# Patient Record
Sex: Female | Born: 1990 | Hispanic: Yes | Marital: Married | Smoking: Never smoker
Health system: Southern US, Community
[De-identification: ages and names within clinical notes are randomized; demographics above are authoritative.]

## PROBLEM LIST (undated history)

## (undated) ENCOUNTER — Inpatient Hospital Stay (HOSPITAL_COMMUNITY): Payer: Self-pay

## (undated) ENCOUNTER — Emergency Department (HOSPITAL_COMMUNITY)

## (undated) DIAGNOSIS — F419 Anxiety disorder, unspecified: Secondary | ICD-10-CM

## (undated) DIAGNOSIS — G44209 Tension-type headache, unspecified, not intractable: Secondary | ICD-10-CM

## (undated) HISTORY — PX: NASAL SINUS SURGERY: SHX719

## (undated) HISTORY — PX: GASTRIC BYPASS: SHX52

## (undated) HISTORY — PX: APPENDECTOMY: SHX54

---

## 2013-12-08 ENCOUNTER — Emergency Department (HOSPITAL_BASED_OUTPATIENT_CLINIC_OR_DEPARTMENT_OTHER)
Admission: EM | Admit: 2013-12-08 | Discharge: 2013-12-08 | Discharge status: Against Medical Advice | Payer: Medicaid Other | Attending: Emergency Medicine | Admitting: Emergency Medicine

## 2013-12-08 ENCOUNTER — Encounter (HOSPITAL_BASED_OUTPATIENT_CLINIC_OR_DEPARTMENT_OTHER): Payer: Self-pay | Admitting: Emergency Medicine

## 2013-12-08 DIAGNOSIS — R110 Nausea: Secondary | ICD-10-CM | POA: Insufficient documentation

## 2013-12-08 DIAGNOSIS — R51 Headache: Secondary | ICD-10-CM | POA: Diagnosis not present

## 2013-12-08 NOTE — ED Notes (Addendum)
Headache and nausea since this am. She drove herself.

## 2014-04-13 HISTORY — PX: OTHER SURGICAL HISTORY: SHX169

## 2015-04-18 ENCOUNTER — Other Ambulatory Visit (HOSPITAL_COMMUNITY): Payer: Self-pay | Admitting: Specialist

## 2015-04-19 ENCOUNTER — Other Ambulatory Visit (HOSPITAL_COMMUNITY): Payer: Self-pay | Admitting: Specialist

## 2015-04-19 DIAGNOSIS — N895 Stricture and atresia of vagina: Secondary | ICD-10-CM

## 2019-03-31 ENCOUNTER — Other Ambulatory Visit: Payer: Self-pay

## 2019-03-31 DIAGNOSIS — Z20822 Contact with and (suspected) exposure to covid-19: Secondary | ICD-10-CM

## 2019-04-01 LAB — NOVEL CORONAVIRUS, NAA: SARS-CoV-2, NAA: DETECTED — AB

## 2019-04-02 ENCOUNTER — Telehealth: Payer: Self-pay

## 2019-04-02 NOTE — Telephone Encounter (Signed)
Pt given Covid-19 positive results. Discussed mild, moderate and severe symptoms. Advised pt to call 911 for any respiratory issues and/dehydration. Discussed non test criteria for ending self isolation. Pt advised of way to manage symptoms at home and review isolation precautions especially the importance of washing hands frequently and wearing a mask when around others. Pt verbalized understanding. Will report to HD. 

## 2019-04-10 ENCOUNTER — Telehealth: Payer: Self-pay | Admitting: *Deleted

## 2019-04-10 NOTE — Telephone Encounter (Signed)
Pt called for result of COVID test obtained 04/08/2019 at the [facility] in [place], [place]; explained this was not a [facility] site, therefore there is no access for this result; pt instructed to contact the [facility]; she verbalized understanding.

## 2019-07-29 ENCOUNTER — Emergency Department (HOSPITAL_BASED_OUTPATIENT_CLINIC_OR_DEPARTMENT_OTHER): Payer: 59

## 2019-07-29 ENCOUNTER — Encounter (HOSPITAL_BASED_OUTPATIENT_CLINIC_OR_DEPARTMENT_OTHER): Payer: Self-pay | Admitting: Emergency Medicine

## 2019-07-29 ENCOUNTER — Emergency Department (HOSPITAL_BASED_OUTPATIENT_CLINIC_OR_DEPARTMENT_OTHER)
Admission: EM | Admit: 2019-07-29 | Discharge: 2019-07-29 | Disposition: A | Discharge status: Home / Self Care | Payer: 59 | Attending: Emergency Medicine | Admitting: Emergency Medicine

## 2019-07-29 ENCOUNTER — Encounter (HOSPITAL_COMMUNITY): Payer: Self-pay | Admitting: Emergency Medicine

## 2019-07-29 ENCOUNTER — Other Ambulatory Visit: Payer: Self-pay

## 2019-07-29 DIAGNOSIS — R1031 Right lower quadrant pain: Secondary | ICD-10-CM | POA: Diagnosis present

## 2019-07-29 DIAGNOSIS — Z9884 Bariatric surgery status: Secondary | ICD-10-CM | POA: Insufficient documentation

## 2019-07-29 DIAGNOSIS — Z79899 Other long term (current) drug therapy: Secondary | ICD-10-CM | POA: Insufficient documentation

## 2019-07-29 DIAGNOSIS — N130 Hydronephrosis with ureteropelvic junction obstruction: Secondary | ICD-10-CM | POA: Diagnosis not present

## 2019-07-29 DIAGNOSIS — N200 Calculus of kidney: Secondary | ICD-10-CM

## 2019-07-29 HISTORY — DX: Anxiety disorder, unspecified: F41.9

## 2019-07-29 LAB — URINALYSIS, ROUTINE W REFLEX MICROSCOPIC
Glucose, UA: 100 mg/dL — AB
Ketones, ur: >80 mg/dL — AB
Leukocytes,Ua: NEGATIVE
Nitrite: NEGATIVE
Protein, ur: 30 mg/dL — AB
Specific Gravity, Urine: >1.03 — ABNORMAL HIGH (ref 1.005–1.030)
pH: 6 (ref 5.0–8.0)

## 2019-07-29 LAB — URINALYSIS, MICROSCOPIC (REFLEX): RBC / HPF: >50 RBC/hpf (ref 0–5)

## 2019-07-29 LAB — CBC WITH DIFFERENTIAL/PLATELET
Abs Immature Granulocytes: 0.03 10*3/uL (ref 0.00–0.07)
Basophils Absolute: 0 10*3/uL (ref 0.0–0.1)
Basophils Relative: 0 %
Eosinophils Absolute: 0.1 10*3/uL (ref 0.0–0.5)
Eosinophils Relative: 1 %
HCT: 39.6 % (ref 36.0–46.0)
Hemoglobin: 13.3 g/dL (ref 12.0–15.0)
Immature Granulocytes: 0 %
Lymphocytes Relative: 21 %
Lymphs Abs: 1.5 10*3/uL (ref 0.7–4.0)
MCH: 29 pg (ref 26.0–34.0)
MCHC: 33.6 g/dL (ref 30.0–36.0)
MCV: 86.5 fL (ref 80.0–100.0)
Monocytes Absolute: 0.5 10*3/uL (ref 0.1–1.0)
Monocytes Relative: 7 %
Neutro Abs: 5.3 10*3/uL (ref 1.7–7.7)
Neutrophils Relative %: 71 %
Platelets: 245 10*3/uL (ref 150–400)
RBC: 4.58 MIL/uL (ref 3.87–5.11)
RDW: 13.5 % (ref 11.5–15.5)
WBC: 7.4 10*3/uL (ref 4.0–10.5)
nRBC: 0 % (ref 0.0–0.2)

## 2019-07-29 LAB — BASIC METABOLIC PANEL
Anion gap: 13 (ref 5–15)
BUN: 13 mg/dL (ref 6–20)
CO2: 21 mmol/L — ABNORMAL LOW (ref 22–32)
Calcium: 9.4 mg/dL (ref 8.9–10.3)
Chloride: 103 mmol/L (ref 98–111)
Creatinine, Ser: 0.79 mg/dL (ref 0.44–1.00)
GFR calc Af Amer: >60 mL/min (ref >60)
GFR calc non Af Amer: >60 mL/min (ref >60)
Glucose, Bld: 80 mg/dL (ref 70–99)
Potassium: 3.6 mmol/L (ref 3.5–5.1)
Sodium: 137 mmol/L (ref 135–145)

## 2019-07-29 LAB — HEPATIC FUNCTION PANEL
ALT: 174 U/L — ABNORMAL HIGH (ref 0–44)
AST: 127 U/L — ABNORMAL HIGH (ref 15–41)
Albumin: 4.4 g/dL (ref 3.5–5.0)
Alkaline Phosphatase: 68 U/L (ref 38–126)
Bilirubin, Direct: 1.2 mg/dL — ABNORMAL HIGH (ref 0.0–0.2)
Indirect Bilirubin: 1.4 mg/dL — ABNORMAL HIGH (ref 0.3–0.9)
Total Bilirubin: 2.6 mg/dL — ABNORMAL HIGH (ref 0.3–1.2)
Total Protein: 7.7 g/dL (ref 6.5–8.1)

## 2019-07-29 LAB — LIPASE, BLOOD: Lipase: 33 U/L (ref 11–51)

## 2019-07-29 LAB — PREGNANCY, URINE: Preg Test, Ur: NEGATIVE

## 2019-07-29 MED ORDER — KETOROLAC TROMETHAMINE 15 MG/ML IJ SOLN: 15.0000 mg | Freq: Once | INTRAMUSCULAR | Status: DC

## 2019-07-29 MED ORDER — OXYCODONE HCL 5 MG PO TABS: 5.0000 mg | ORAL_TABLET | ORAL | 0 refills | Status: DC | PRN

## 2019-07-29 MED ORDER — SODIUM CHLORIDE 0.9 % IV BOLUS
1000.0000 mL | Freq: Once | INTRAVENOUS | Status: AC
  Administered 2019-07-29: 11:00:00 1000 mL via INTRAVENOUS

## 2019-07-29 MED ORDER — ONDANSETRON HCL 4 MG/2ML IJ SOLN
4.0000 mg | Freq: Once | INTRAMUSCULAR | Status: AC
  Administered 2019-07-29: 13:00:00 4 mg via INTRAVENOUS
  Filled 2019-07-29: qty 2

## 2019-07-29 MED ORDER — CEPHALEXIN 500 MG PO CAPS: 500.0000 mg | ORAL_CAPSULE | Freq: Two times a day (BID) | ORAL | 0 refills | Status: AC

## 2019-07-29 MED ORDER — HYDROMORPHONE HCL 1 MG/ML IJ SOLN
1.0000 mg | Freq: Once | INTRAMUSCULAR | Status: AC
  Administered 2019-07-29: 1 mg via INTRAVENOUS
  Filled 2019-07-29: qty 1

## 2019-07-29 MED ORDER — TAMSULOSIN HCL 0.4 MG PO CAPS: 0.4000 mg | ORAL_CAPSULE | Freq: Every day | ORAL | 0 refills | Status: AC

## 2019-07-29 MED ORDER — MORPHINE SULFATE (PF) 4 MG/ML IV SOLN
4.0000 mg | Freq: Once | INTRAVENOUS | Status: AC
  Administered 2019-07-29: 4 mg via INTRAVENOUS
  Filled 2019-07-29: qty 1

## 2019-07-29 MED ORDER — ONDANSETRON HCL 4 MG PO TABS: 4.0000 mg | ORAL_TABLET | Freq: Four times a day (QID) | ORAL | 0 refills | Status: AC

## 2019-07-29 MED ORDER — ONDANSETRON HCL 4 MG/2ML IJ SOLN
4.0000 mg | Freq: Once | INTRAMUSCULAR | Status: AC
  Administered 2019-07-29: 4 mg via INTRAVENOUS
  Filled 2019-07-29: qty 2

## 2019-07-29 NOTE — ED Provider Notes (Signed)
[facility] EMERGENCY DEPARTMENT Provider Note   CSN: [id number] Arrival date & time: 07/29/19  1029     History Chief Complaint  Patient presents with   Flank Pain    Kristin Salazar is a 29 y.o. female.  Patient with severe right-sided flank pain this morning.  History of appendicitis and had appendix removed in the past.  History of gastric bypass.  Denies history of kidney stones.  No pain with urination.  The history is provided by the patient.  Flank Pain This is a new problem. The current episode started 1 to 2 hours ago. The problem occurs constantly. Associated symptoms include abdominal pain. Pertinent negatives include no chest pain, no headaches and no shortness of breath. Nothing aggravates the symptoms. Nothing relieves the symptoms. She has tried nothing for the symptoms. The treatment provided no relief.       Past Medical History:  Diagnosis Date   Anxiety     There are no problems to display for this patient.   Past Surgical History:  Procedure Laterality Date   APPENDECTOMY     CESAREAN SECTION     GASTRIC BYPASS     NASAL SINUS SURGERY       OB History   No obstetric history on file.     No family history on file.  Social History   Tobacco Use   Smoking status: Never Smoker   Smokeless tobacco: Never Used  Substance Use Topics   Alcohol use: No   Drug use: No    Home Medications Prior to Admission medications   Medication Sig Start Date End Date Taking? Authorizing Provider  Multiple Vitamin (MULTIVITAMIN) tablet Take 1 tablet by mouth daily.   Yes [provider]  cephALEXin (KEFLEX) 500 MG capsule Take 1 capsule (500 mg total) by mouth 2 (two) times daily for 7 days. 07/29/19 08/05/19  Oriana Horiuchi, DO  ondansetron (ZOFRAN) 4 MG tablet Take 1 tablet (4 mg total) by mouth every 6 (six) hours for 15 doses. 07/29/19 08/02/19  Ronique Simerly, DO  oxyCODONE (ROXICODONE) 5 MG immediate release tablet Take 1  tablet (5 mg total) by mouth every 4 (four) hours as needed for up to 15 doses for severe pain. 07/29/19   Taiesha Bovard, DO  tamsulosin (FLOMAX) 0.4 MG CAPS capsule Take 1 capsule (0.4 mg total) by mouth daily for 5 days. 07/29/19 08/03/19  Virgina Norfolk, DO    Allergies    Patient has no known allergies.  Review of Systems   Review of Systems  Constitutional: Negative for chills and fever.  HENT: Negative for ear pain and sore throat.   Eyes: Negative for pain and visual disturbance.  Respiratory: Negative for cough and shortness of breath.   Cardiovascular: Negative for chest pain and palpitations.  Gastrointestinal: Positive for abdominal pain and nausea. Negative for vomiting.  Genitourinary: Positive for flank pain. Negative for decreased urine volume, dysuria, hematuria, menstrual problem, pelvic pain, urgency, vaginal bleeding, vaginal discharge and vaginal pain.  Musculoskeletal: Negative for arthralgias and back pain.  Skin: Negative for color change and rash.  Neurological: Negative for seizures, syncope and headaches.  All other systems reviewed and are negative.   Physical Exam Updated Vital Signs  ED Triage Vitals  Enc Vitals Group     BP 07/29/19 1035 115/62     Pulse Rate 07/29/19 1033 64     Resp 07/29/19 1033 18     Temp 07/29/19 1033 98.1 F (36.7 C)  Temp Source 07/29/19 1033 Oral     SpO2 07/29/19 1032 100 %     Weight 07/29/19 1031 228 lb (103.4 kg)     Height 07/29/19 1031 5\' 5"  (1.651 m)     Head Circumference --      Peak Flow --      Pain Score 07/29/19 1031 10     Pain Loc --      Pain Edu? --      Excl. in [place]? --     Physical Exam Vitals and nursing note reviewed.  Constitutional:      General: She is in acute distress.     Appearance: She is well-developed.  HENT:     Head: Normocephalic and atraumatic.     Nose: Nose normal.     Mouth/Throat:     Mouth: Mucous membranes are moist.  Eyes:     Conjunctiva/sclera: Conjunctivae  normal.     Pupils: Pupils are equal, round, and reactive to light.  Cardiovascular:     Rate and Rhythm: Normal rate and regular rhythm.     Pulses: Normal pulses.     Heart sounds: Normal heart sounds. No murmur.  Pulmonary:     Effort: Pulmonary effort is normal. No respiratory distress.     Breath sounds: Normal breath sounds.  Abdominal:     General: There is no distension.     Palpations: Abdomen is soft.     Tenderness: There is no abdominal tenderness. There is right CVA tenderness.  Musculoskeletal:        General: Normal range of motion.     Cervical back: Normal range of motion and neck supple.  Skin:    General: Skin is warm and dry.     Capillary Refill: Capillary refill takes less than 2 seconds.  Neurological:     General: No focal deficit present.     Mental Status: She is alert.     ED Results / Procedures / Treatments   Labs (all labs ordered are listed, but only abnormal results are displayed) Labs Reviewed  URINALYSIS, ROUTINE W REFLEX MICROSCOPIC - Abnormal; Notable for the following components:      Result Value   APPearance HAZY (*)    Specific Gravity, Urine >1.030 (*)    Glucose, UA 100 (*)    Hgb urine dipstick LARGE (*)    Bilirubin Urine MODERATE (*)    Ketones, ur >80 (*)    Protein, ur 30 (*)    All other components within normal limits  BASIC METABOLIC PANEL - Abnormal; Notable for the following components:   CO2 21 (*)    All other components within normal limits  HEPATIC FUNCTION PANEL - Abnormal; Notable for the following components:   AST 127 (*)    ALT 174 (*)    Total Bilirubin 2.6 (*)    Bilirubin, Direct 1.2 (*)    Indirect Bilirubin 1.4 (*)    All other components within normal limits  URINALYSIS, MICROSCOPIC (REFLEX) - Abnormal; Notable for the following components:   Bacteria, UA MANY (*)    All other components within normal limits  PREGNANCY, URINE  CBC WITH DIFFERENTIAL/PLATELET  LIPASE, BLOOD     EKG None  Radiology CT Renal Stone Study  Result Date: 07/29/2019 CLINICAL DATA:  Abdominal pain, nausea, vomiting constipation. Reported mini gastric bypass surgery on 07/10/2019 in [place]. EXAM: CT ABDOMEN AND PELVIS WITHOUT CONTRAST TECHNIQUE: Multidetector CT imaging of the abdomen and pelvis was performed following  the standard protocol without IV contrast. COMPARISON:  10/09/2016 CT abdomen/pelvis. FINDINGS: Lower chest: No significant pulmonary nodules or acute consolidative airspace disease. Hepatobiliary: Normal liver size. No liver mass. Normal gallbladder with no radiopaque cholelithiasis. No biliary ductal dilatation. Pancreas: Normal, with no mass or duct dilation. Spleen: Normal size. No mass. Adrenals/Urinary Tract: Normal adrenals. Obstructing 3 mm right ureterovesical junction stone with mild to moderate right hydroureteronephrosis. No stones in the kidneys. No left hydronephrosis. Normal caliber left ureter. No additional ureteral stones. No contour deforming renal masses. Otherwise normal nondistended bladder. Stomach/Bowel: Postsurgical changes from gastric bypass surgery with apparent gastrojejunostomy and collapsed excluded remnant distal stomach with no appreciable acute gastric abnormality. Normal caliber small bowel with no small bowel wall thickening. Appendectomy. Normal large bowel with no diverticulosis, large bowel wall thickening or pericolonic fat stranding. Vascular/Lymphatic: Normal caliber abdominal aorta. No pathologically enlarged lymph nodes in the abdomen or pelvis. Reproductive: Grossly normal uterus.  No adnexal mass. Other: No pneumoperitoneum, ascites or focal fluid collection. Musculoskeletal: No aggressive appearing focal osseous lesions. IMPRESSION: Obstructing 3 mm right UVJ stone with mild to moderate right hydroureteronephrosis. Postsurgical changes from gastric bypass surgery without appreciable complication on this noncontrast CT study. Electronically  Signed   By: Delbert Phenix M.D.   On: 07/29/2019 12:48    Procedures Procedures (including critical care time)  Medications Ordered in ED Medications  morphine 4 MG/ML injection 4 mg (has no administration in time range)  ondansetron (ZOFRAN) injection 4 mg (has no administration in time range)  HYDROmorphone (DILAUDID) injection 1 mg (1 mg Intravenous Given 07/29/19 1058)  ondansetron (ZOFRAN) injection 4 mg (4 mg Intravenous Given 07/29/19 1058)  sodium chloride 0.9 % bolus 1,000 mL (0 mLs Intravenous Stopped 07/29/19 1207)    ED Course  I have reviewed the triage vital signs and the nursing notes.  Pertinent labs & imaging results that were available during my care of the patient were reviewed by me and considered in my medical decision making (see chart for details).    MDM Rules/Calculators/A&P                      Kristin Salazar is a 29 year old female with history of gastric bypass, appendectomy who presents the ED with right-sided flank pain.  Patient with normal vitals.  No fever.  Pain started this morning.  Severe and right-sided.  No hematuria.  No pain with urination.  Has right CVA tenderness on exam.  Likely pyelonephritis versus kidney stone.  Possibly gallbladder or liver etiology including cholecystitis.  Possibly pancreatitis.  Will get lab work including CT scan abdomen and pelvis.  Will give IV fluids, IV Zofran, IV Dilaudid.  Will check pregnancy test.  Patient with 3 mm obstructing right UVJ kidney stone. No significant leukocytosis. Urinalysis with negative nitrates and leukocytes. However many bacteria. Will conservatively treat with antibiotics. Otherwise no significant anemia, electrolyte abnormality. Liver and gallbladder enzymes mildly elevated however on CT scan they are normal appearing. Likely in the setting of nausea and vomiting dehydration. Overall patient feels improved after IV fluids and IV pain medicine. Will prescribe Keflex, Zofran, Flomax,  oxycodone. Will have her follow-up with urology. Understands return precautions.  This chart was dictated using voice recognition software.  Despite best efforts to proofread,  errors can occur which can change the documentation meaning.    Final Clinical Impression(s) / ED Diagnoses Final diagnoses:  Kidney calculus    Rx / DC Orders ED Discharge Orders  Ordered    oxyCODONE (ROXICODONE) 5 MG immediate release tablet  Every 4 hours PRN     07/29/19 1312    cephALEXin (KEFLEX) 500 MG capsule  2 times daily     07/29/19 1312    ondansetron (ZOFRAN) 4 MG tablet  Every 6 hours     07/29/19 1312    tamsulosin (FLOMAX) 0.4 MG CAPS capsule  Daily     07/29/19 1312           Virgina Norfolk, DO 07/29/19 1312

## 2019-07-29 NOTE — ED Notes (Signed)
Pt ambulatory with steady gait to restroom to provide urine specimen 

## 2019-07-29 NOTE — ED Triage Notes (Signed)
Patient was seen today. Patient states she has not taken the antibiotic but has taken the nausea and pain medication. Patient states she could not take the antibiotic because they are to big to take. Patient states that the doctor told her to come back if her pain does not get better.

## 2019-07-29 NOTE — ED Triage Notes (Signed)
Pt arrives via EMS c/o R flank pain since 6 am. States she vomited once. Denies urinary symptoms

## 2019-07-30 ENCOUNTER — Emergency Department (HOSPITAL_COMMUNITY)
Admission: EM | Admit: 2019-07-30 | Discharge: 2019-07-30 | Disposition: A | Discharge status: Home / Self Care | Payer: 59 | Source: Home / Self Care | Attending: Emergency Medicine | Admitting: Emergency Medicine

## 2019-07-30 DIAGNOSIS — N23 Unspecified renal colic: Secondary | ICD-10-CM

## 2019-07-30 DIAGNOSIS — R7401 Elevation of levels of liver transaminase levels: Secondary | ICD-10-CM

## 2019-07-30 DIAGNOSIS — N201 Calculus of ureter: Secondary | ICD-10-CM

## 2019-07-30 DIAGNOSIS — R112 Nausea with vomiting, unspecified: Secondary | ICD-10-CM

## 2019-07-30 DIAGNOSIS — E872 Acidosis, unspecified: Secondary | ICD-10-CM

## 2019-07-30 DIAGNOSIS — R17 Unspecified jaundice: Secondary | ICD-10-CM

## 2019-07-30 LAB — COMPREHENSIVE METABOLIC PANEL
ALT: 151 U/L — ABNORMAL HIGH (ref 0–44)
AST: 86 U/L — ABNORMAL HIGH (ref 15–41)
Albumin: 4 g/dL (ref 3.5–5.0)
Alkaline Phosphatase: 62 U/L (ref 38–126)
Anion gap: 15 (ref 5–15)
BUN: 10 mg/dL (ref 6–20)
CO2: 15 mmol/L — ABNORMAL LOW (ref 22–32)
Calcium: 9.2 mg/dL (ref 8.9–10.3)
Chloride: 107 mmol/L (ref 98–111)
Creatinine, Ser: 0.92 mg/dL (ref 0.44–1.00)
GFR calc Af Amer: >60 mL/min (ref >60)
GFR calc non Af Amer: >60 mL/min (ref >60)
Glucose, Bld: 97 mg/dL (ref 70–99)
Potassium: 4.6 mmol/L (ref 3.5–5.1)
Sodium: 137 mmol/L (ref 135–145)
Total Bilirubin: 2.2 mg/dL — ABNORMAL HIGH (ref 0.3–1.2)
Total Protein: 7.2 g/dL (ref 6.5–8.1)

## 2019-07-30 LAB — CBC WITH DIFFERENTIAL/PLATELET
Abs Immature Granulocytes: 0.02 10*3/uL (ref 0.00–0.07)
Basophils Absolute: 0 10*3/uL (ref 0.0–0.1)
Basophils Relative: 0 %
Eosinophils Absolute: 0 10*3/uL (ref 0.0–0.5)
Eosinophils Relative: 0 %
HCT: 38.2 % (ref 36.0–46.0)
Hemoglobin: 12.5 g/dL (ref 12.0–15.0)
Immature Granulocytes: 0 %
Lymphocytes Relative: 9 %
Lymphs Abs: 0.7 10*3/uL (ref 0.7–4.0)
MCH: 29.1 pg (ref 26.0–34.0)
MCHC: 32.7 g/dL (ref 30.0–36.0)
MCV: 88.8 fL (ref 80.0–100.0)
Monocytes Absolute: 0.5 10*3/uL (ref 0.1–1.0)
Monocytes Relative: 6 %
Neutro Abs: 7 10*3/uL (ref 1.7–7.7)
Neutrophils Relative %: 85 %
Platelets: 204 10*3/uL (ref 150–400)
RBC: 4.3 MIL/uL (ref 3.87–5.11)
RDW: 13.5 % (ref 11.5–15.5)
WBC: 8.3 10*3/uL (ref 4.0–10.5)
nRBC: 0 % (ref 0.0–0.2)

## 2019-07-30 MED ORDER — PROCHLORPERAZINE 25 MG RE SUPP: 25.0000 mg | Freq: Two times a day (BID) | RECTAL | 0 refills | Status: DC | PRN

## 2019-07-30 MED ORDER — KETOROLAC TROMETHAMINE 30 MG/ML IJ SOLN
30.0000 mg | Freq: Once | INTRAMUSCULAR | Status: AC
  Administered 2019-07-30: 02:00:00 30 mg via INTRAVENOUS
  Filled 2019-07-30: qty 1

## 2019-07-30 MED ORDER — SODIUM CHLORIDE 0.9 % IV BOLUS
1000.0000 mL | Freq: Once | INTRAVENOUS | Status: AC
  Administered 2019-07-30: 1000 mL via INTRAVENOUS

## 2019-07-30 MED ORDER — PROCHLORPERAZINE EDISYLATE 10 MG/2ML IJ SOLN
10.0000 mg | Freq: Once | INTRAMUSCULAR | Status: AC
  Administered 2019-07-30: 10 mg via INTRAMUSCULAR
  Filled 2019-07-30: qty 2

## 2019-07-30 MED ORDER — MORPHINE SULFATE (PF) 4 MG/ML IV SOLN
4.0000 mg | Freq: Once | INTRAVENOUS | Status: AC
  Administered 2019-07-30: 02:00:00 4 mg via INTRAVENOUS
  Filled 2019-07-30: qty 1

## 2019-07-30 MED ORDER — ONDANSETRON HCL 4 MG/2ML IJ SOLN
4.0000 mg | Freq: Once | INTRAMUSCULAR | Status: AC
  Administered 2019-07-30: 02:00:00 4 mg via INTRAVENOUS
  Filled 2019-07-30: qty 2

## 2019-07-30 NOTE — ED Notes (Signed)
When pt started getting up for discharge she developed N/V. MD notified. Will give meds and reevaluate.

## 2019-07-30 NOTE — ED Notes (Signed)
Pt lying in bed awake. Pt feels better and is ready for d/c

## 2019-07-30 NOTE — ED Provider Notes (Addendum)
[facility]-EMERGENCY DEPT Provider Note   CSN: [id number] Arrival date & time: 07/29/19  2329   History Chief Complaint  Patient presents with  . Flank Pain    right side  . Back Pain    lower right    Kristin Salazar is a 29 y.o. female.  The history is provided by the patient.  Flank Pain  Back Pain She has history of kidney stone diagnosed this morning at [facility] and was discharged with prescriptions for oxycodone, cephalexin, ondansetron, tamsulosin.  Since discharge, she is continuing to have pain in the right flank and right lower abdomen which is severe and she rates it at 10/10.  She did take a dose of oxycodone with no relief.  She has not been able to take fluids because of ongoing nausea.  She has a constant sense of urinary urgency but has urinated very little.  She denies fever but has had some chills.  Past Medical History:  Diagnosis Date  . Anxiety     There are no problems to display for this patient.   Past Surgical History:  Procedure Laterality Date  . APPENDECTOMY    . CESAREAN SECTION    . GASTRIC BYPASS    . NASAL SINUS SURGERY       OB History   No obstetric history on file.     History reviewed. No pertinent family history.  Social History   Tobacco Use  . Smoking status: Never Smoker  . Smokeless tobacco: Never Used  Substance Use Topics  . Alcohol use: No  . Drug use: No    Home Medications Prior to Admission medications   Medication Sig Start Date End Date Taking? Authorizing Provider  cephALEXin (KEFLEX) 500 MG capsule Take 1 capsule (500 mg total) by mouth 2 (two) times daily for 7 days. 07/29/19 08/05/19  Virgina Norfolk, DO  Multiple Vitamin (MULTIVITAMIN) tablet Take 1 tablet by mouth daily.    [provider]  ondansetron (ZOFRAN) 4 MG tablet Take 1 tablet (4 mg total) by mouth every 6 (six) hours for 15 doses. 07/29/19 08/02/19  Curatolo, Adam, DO  oxyCODONE (ROXICODONE) 5 MG  immediate release tablet Take 1 tablet (5 mg total) by mouth every 4 (four) hours as needed for up to 15 doses for severe pain. 07/29/19   Curatolo, Adam, DO  tamsulosin (FLOMAX) 0.4 MG CAPS capsule Take 1 capsule (0.4 mg total) by mouth daily for 5 days. 07/29/19 08/03/19  Virgina Norfolk, DO    Allergies    Patient has no known allergies.  Review of Systems   Review of Systems  Genitourinary: Positive for flank pain.  Musculoskeletal: Positive for back pain.  All other systems reviewed and are negative.   Physical Exam Updated Vital Signs BP (!) 150/86 (BP Location: Right Arm)   Pulse (!) 107   Temp 98 F (36.7 C) (Oral)   Resp 18   Ht 5\' 5"  (1.651 m)   Wt 103.4 kg   SpO2 100%   BMI 37.94 kg/m   Physical Exam Vitals and nursing note reviewed.   29 year old female, appears uncomfortable, but is in no acute distress. Vital signs are significant for elevated heart rate and blood pressure. Oxygen saturation is 100%, which is normal. Head is normocephalic and atraumatic. PERRLA, EOMI. Oropharynx is clear. Neck is nontender and supple without adenopathy or JVD. Back is nontender and there is no CVA tenderness. Lungs are clear without rales, wheezes, or  rhonchi. Chest is nontender. Heart has regular rate and rhythm without murmur. Abdomen is soft, flat, with moderate right lower quadrant tenderness.  There is no rebound or guarding.  There are no masses or hepatosplenomegaly and peristalsis is hypoactive. Extremities have no cyanosis or edema, full range of motion is present. Skin is warm and dry without rash. Neurologic: Mental status is normal, cranial nerves are intact, there are no motor or sensory deficits.  ED Results / Procedures / Treatments   Labs (all labs ordered are listed, but only abnormal results are displayed) Labs Reviewed  COMPREHENSIVE METABOLIC PANEL - Abnormal; Notable for the following components:      Result Value   CO2 15 (*)    AST 86 (*)    ALT 151  (*)    Total Bilirubin 2.2 (*)    All other components within normal limits  CBC WITH DIFFERENTIAL/PLATELET    Radiology CT Renal Stone Study  Result Date: 07/29/2019 CLINICAL DATA:  Abdominal pain, nausea, vomiting constipation. Reported mini gastric bypass surgery on 07/10/2019 in [place]. EXAM: CT ABDOMEN AND PELVIS WITHOUT CONTRAST TECHNIQUE: Multidetector CT imaging of the abdomen and pelvis was performed following the standard protocol without IV contrast. COMPARISON:  10/09/2016 CT abdomen/pelvis. FINDINGS: Lower chest: No significant pulmonary nodules or acute consolidative airspace disease. Hepatobiliary: Normal liver size. No liver mass. Normal gallbladder with no radiopaque cholelithiasis. No biliary ductal dilatation. Pancreas: Normal, with no mass or duct dilation. Spleen: Normal size. No mass. Adrenals/Urinary Tract: Normal adrenals. Obstructing 3 mm right ureterovesical junction stone with mild to moderate right hydroureteronephrosis. No stones in the kidneys. No left hydronephrosis. Normal caliber left ureter. No additional ureteral stones. No contour deforming renal masses. Otherwise normal nondistended bladder. Stomach/Bowel: Postsurgical changes from gastric bypass surgery with apparent gastrojejunostomy and collapsed excluded remnant distal stomach with no appreciable acute gastric abnormality. Normal caliber small bowel with no small bowel wall thickening. Appendectomy. Normal large bowel with no diverticulosis, large bowel wall thickening or pericolonic fat stranding. Vascular/Lymphatic: Normal caliber abdominal aorta. No pathologically enlarged lymph nodes in the abdomen or pelvis. Reproductive: Grossly normal uterus.  No adnexal mass. Other: No pneumoperitoneum, ascites or focal fluid collection. Musculoskeletal: No aggressive appearing focal osseous lesions. IMPRESSION: Obstructing 3 mm right UVJ stone with mild to moderate right hydroureteronephrosis. Postsurgical changes from  gastric bypass surgery without appreciable complication on this noncontrast CT study. Electronically Signed   By: Ilona Sorrel M.D.   On: 07/29/2019 12:48   Procedures Procedures   Medications Ordered in ED Medications  sodium chloride 0.9 % bolus 1,000 mL (has no administration in time range)  ketorolac (TORADOL) 30 MG/ML injection 30 mg (has no administration in time range)  ondansetron (ZOFRAN) injection 4 mg (has no administration in time range)  morphine 4 MG/ML injection 4 mg (has no administration in time range)    ED Course  I have reviewed the triage vital signs and the nursing notes.  Pertinent labs & imaging results that were available during my care of the patient were reviewed by me and considered in my medical decision making (see chart for details).  MDM Rules/Calculators/A&P Ongoing ureteral colic.  Old records are reviewed confirming ED visit earlier today with CT showing 3 mm calculus at the right ureterovesical junction.  She also had mildly elevated transaminases and bilirubin.  We will give IV fluids, morphine, ondansetron, ketorolac and reassess.  No need for repeat imaging.  She has had excellent relief of pain here, but unfortunately cannot  take NSAIDs at home because of gastric bypass surgery.  Labs do show slight improvement in transaminases and bilirubin.  CO2 is now low but with normal anion gap.  This is felt to be secondary to vomiting and should correct with hydration.  She is discharged with prescription for prochlorperazine suppository, continue using tamsulosin and oxycodone and cephalexin.  Advised to use an aspirin suppository to get prostaglandin blockade.  Recommended repeat liver profile in 2 weeks, follow-up with urology.  Return precautions discussed.  Final Clinical Impression(s) / ED Diagnoses Final diagnoses:  Renal colic on right side  Ureterolithiasis  Non-intractable vomiting with nausea, unspecified vomiting type  Elevated transaminase level   Total bilirubin, elevated  Metabolic acidosis    Rx / DC Orders ED Discharge Orders         Ordered    prochlorperazine (COMPAZINE) 25 MG suppository  Every 12 hours PRN     07/30/19 0334           Dione Booze, MD 07/30/19 [phone]   As patient was being discharged, she had recurrence of nausea.  She will be given a dose of intramuscular prochlorperazine.   Dione Booze, MD 07/30/19 [phone]  She felt much better following intramuscular prochlorperazine.  She was ambulated in the ED without any difficulty and was discharged with the prescriptions noted above.   Dione Booze, MD 07/30/19 [phone]

## 2019-07-30 NOTE — ED Notes (Signed)
Pt lying in bed. Full monitor on. Pt denies any needs except pain meds. Will continue to monitor.

## 2019-07-30 NOTE — ED Notes (Signed)
Pt up walking around the room and states she feels much better. Pt is ready for d/c. No N/V.

## 2019-07-30 NOTE — Discharge Instructions (Addendum)
Please insert one aspirin suppository in the rectum once a day.  Return if pain or nausea are not being adequately controlled at home.  Return if you start running a fever.

## 2019-07-30 NOTE — ED Notes (Signed)
Pt lying in bed, eye's closed, chest rising and falling. NAD noted. Will continue to monitor.  

## 2021-07-15 IMAGING — CT CT RENAL STONE PROTOCOL
2 of 4 series · 16 of 46 positions shown, 18 images · non-contrast
Comparison: 10/09/2016 CT abdomen/pelvis.

CLINICAL DATA: Abdominal pain, nausea, vomiting constipation.
Reported mini gastric bypass surgery on 07/10/2019 in [REDACTED].

EXAM:
CT ABDOMEN AND PELVIS WITHOUT CONTRAST
TECHNIQUE: Multidetector CT imaging of the abdomen and pelvis was performed
following the standard protocol without IV contrast.

[Series 2: axial st · axial · 0.80mm/px · z∈[-802,-357]mm · 13 of 97 slices shown, 15 images]
[im 4/97  soft-tissue]
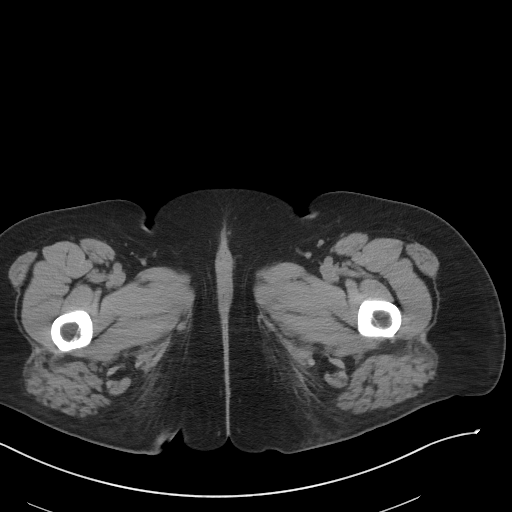
[im 4/97  bone]
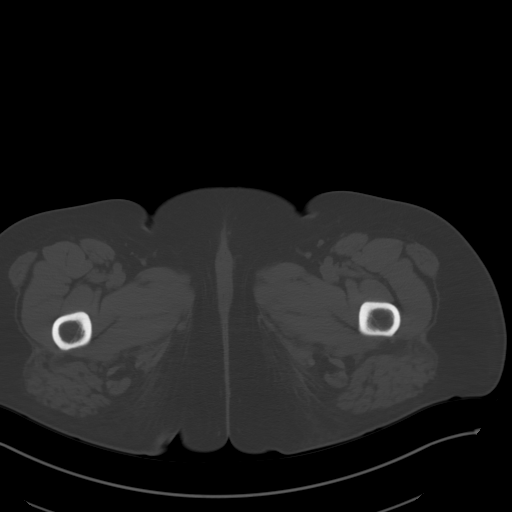
[im 12/97  soft-tissue]
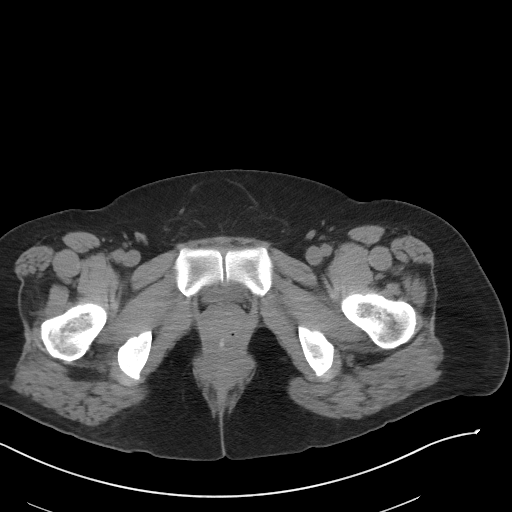
[im 19/97  soft-tissue]
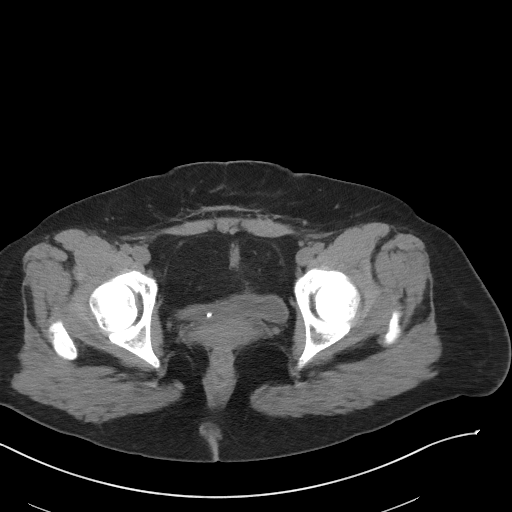
[im 26/97  soft-tissue]
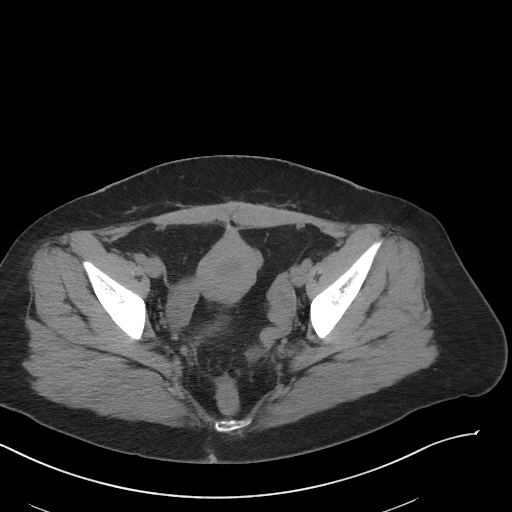
[im 34/97  soft-tissue]
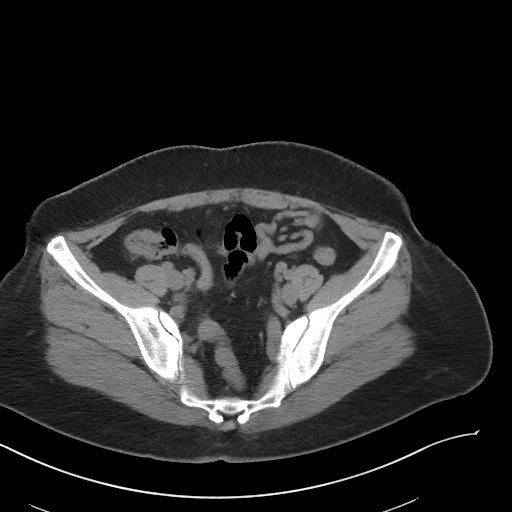
[im 41/97  soft-tissue]
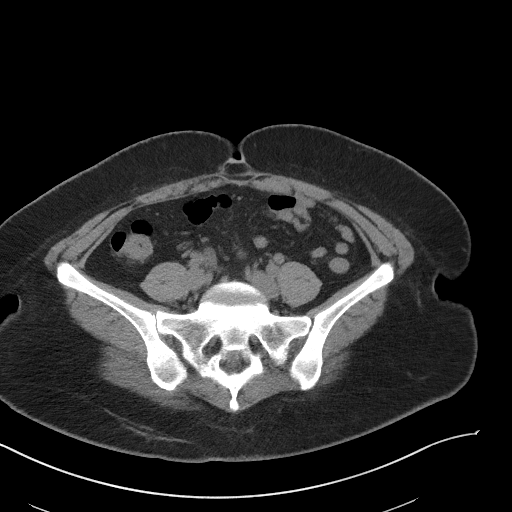
[im 49/97  soft-tissue]
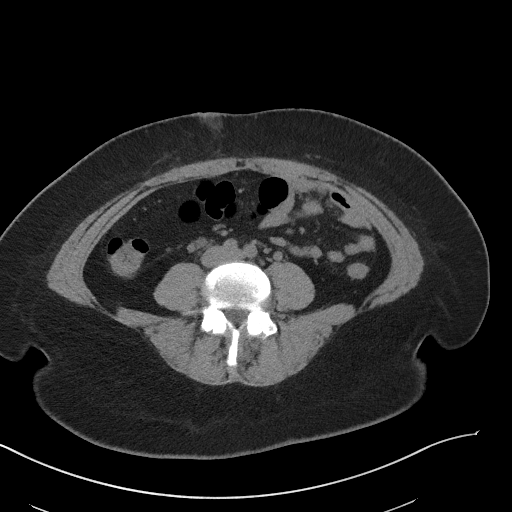
[im 56/97  soft-tissue]
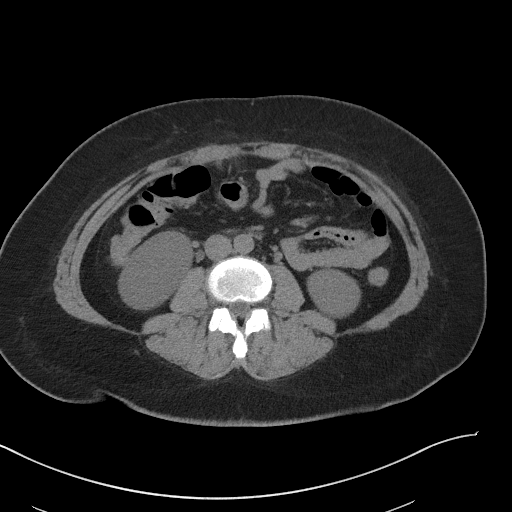
[im 63/97  soft-tissue]
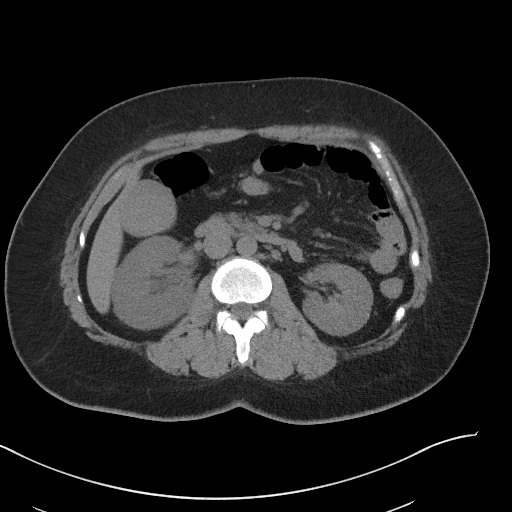
[im 63/97  bone]
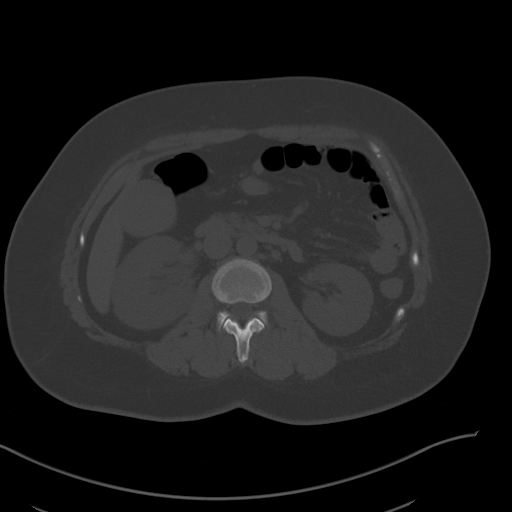
[im 71/97  soft-tissue]
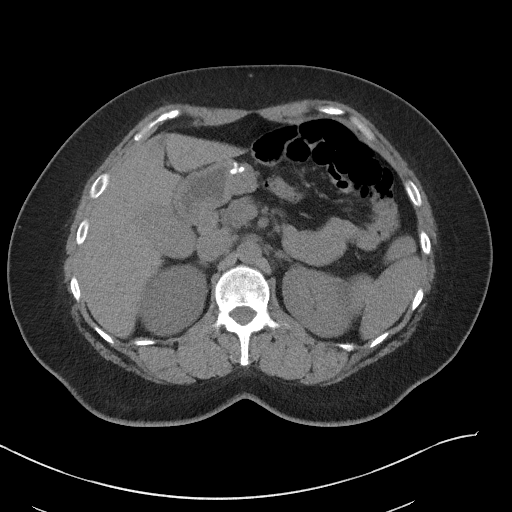
[im 78/97  soft-tissue]
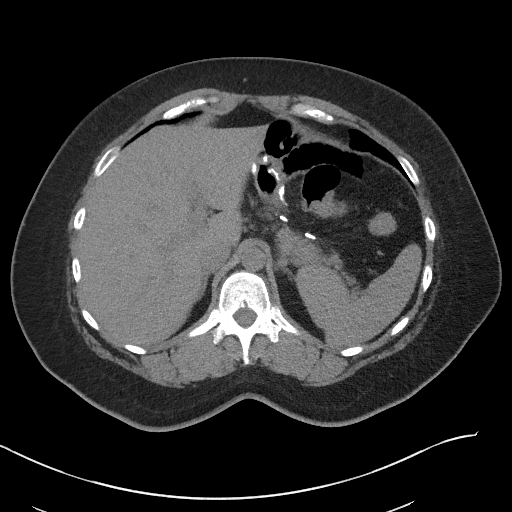
[im 85/97  soft-tissue]
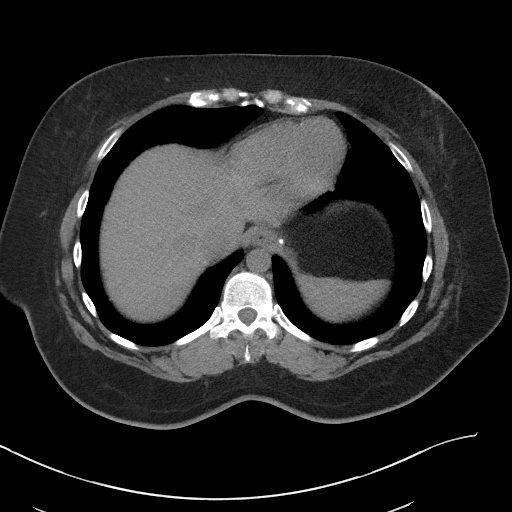
[im 93/97  soft-tissue]
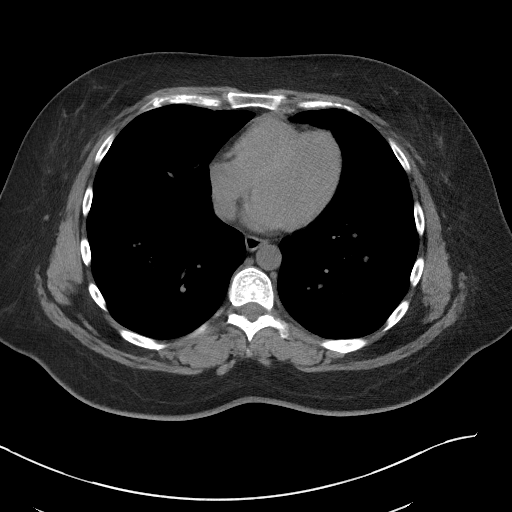

[Series 5: coronal st · coronal · 0.89mm/px · 3 of 113 slices shown]
[im 38/113  soft-tissue]
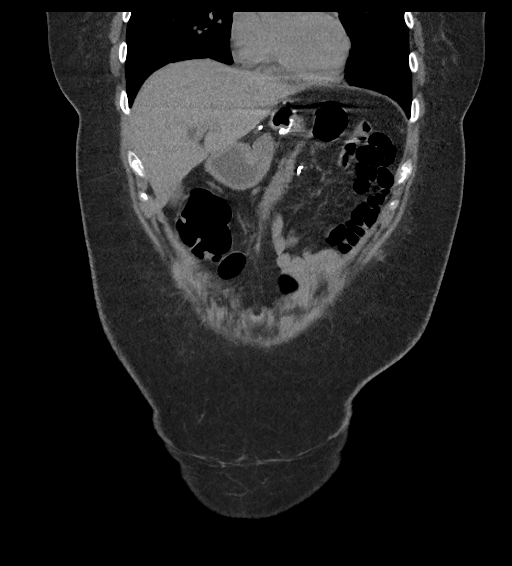
[im 50/113  soft-tissue]
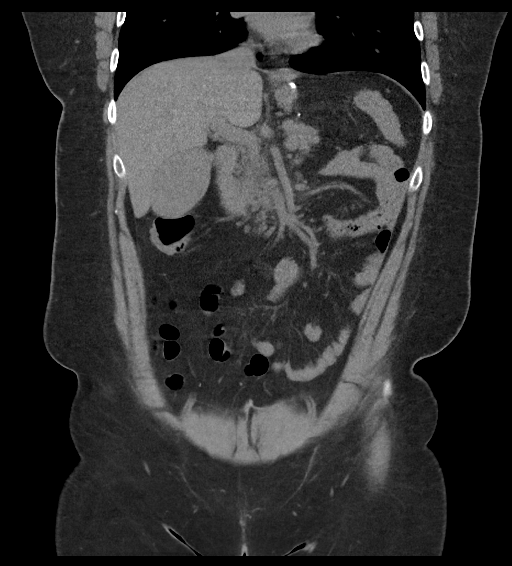
[im 63/113  soft-tissue]
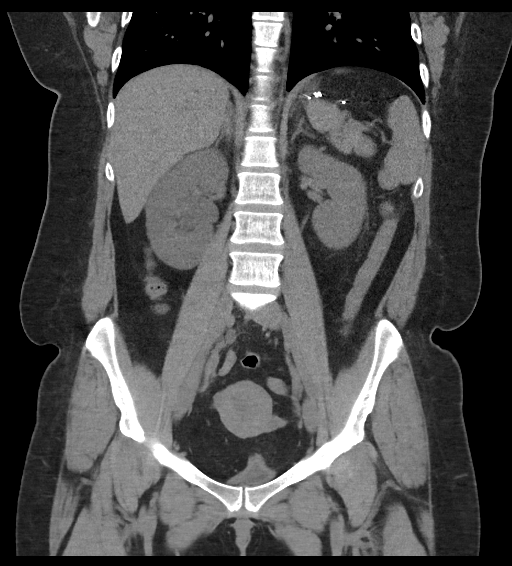

[16 of 46 positions shown; findings below may reference images not displayed]

FINDINGS: Lower chest: No significant pulmonary nodules or acute consolidative
airspace disease.

Hepatobiliary: Normal liver size. No liver mass. Normal gallbladder
with no radiopaque cholelithiasis. No biliary ductal dilatation.

Pancreas: Normal, with no mass or duct dilation.

Spleen: Normal size. No mass.

Adrenals/Urinary Tract: Normal adrenals. Obstructing 3 mm right
ureterovesical junction stone with mild to moderate right
hydroureteronephrosis. No stones in the kidneys. No left
hydronephrosis. Normal caliber left ureter. No additional ureteral
stones. No contour deforming renal masses. Otherwise normal
nondistended bladder.

Stomach/Bowel: Postsurgical changes from gastric bypass surgery with
apparent gastrojejunostomy and collapsed excluded remnant distal
stomach with no appreciable acute gastric abnormality. Normal
caliber small bowel with no small bowel wall thickening.
Appendectomy. Normal large bowel with no diverticulosis, large bowel
wall thickening or pericolonic fat stranding.

Vascular/Lymphatic: Normal caliber abdominal aorta. No
pathologically enlarged lymph nodes in the abdomen or pelvis.

Reproductive: Grossly normal uterus.  No adnexal mass.

Other: No pneumoperitoneum, ascites or focal fluid collection.

Musculoskeletal: No aggressive appearing focal osseous lesions.
IMPRESSION: Obstructing 3 mm right UVJ stone with mild to moderate right
hydroureteronephrosis.

Postsurgical changes from gastric bypass surgery without appreciable
complication on this noncontrast CT study.

## 2023-07-26 ENCOUNTER — Other Ambulatory Visit: Payer: Self-pay

## 2023-07-26 ENCOUNTER — Encounter (HOSPITAL_BASED_OUTPATIENT_CLINIC_OR_DEPARTMENT_OTHER): Payer: Self-pay

## 2023-07-26 DIAGNOSIS — R519 Headache, unspecified: Secondary | ICD-10-CM | POA: Insufficient documentation

## 2023-07-26 NOTE — ED Triage Notes (Signed)
 HA started 6 am Has taken several things OTC with no relief +nausea +photosensitivity

## 2023-07-27 ENCOUNTER — Emergency Department (HOSPITAL_BASED_OUTPATIENT_CLINIC_OR_DEPARTMENT_OTHER)
Admission: EM | Admit: 2023-07-27 | Discharge: 2023-07-27 | Disposition: A | Discharge status: Home / Self Care | Attending: Emergency Medicine | Admitting: Emergency Medicine

## 2023-07-27 DIAGNOSIS — R519 Headache, unspecified: Secondary | ICD-10-CM

## 2023-07-27 HISTORY — DX: Tension-type headache, unspecified, not intractable: G44.209

## 2023-07-27 MED ORDER — HALOPERIDOL LACTATE 5 MG/ML IJ SOLN
5.0000 mg | Freq: Once | INTRAMUSCULAR | Status: AC
  Administered 2023-07-27: 5 mg via INTRAMUSCULAR
  Filled 2023-07-27: qty 1

## 2023-07-27 MED ORDER — NAPROXEN 250 MG PO TABS
500.0000 mg | ORAL_TABLET | Freq: Once | ORAL | Status: AC
  Administered 2023-07-27: 500 mg via ORAL
  Filled 2023-07-27: qty 2

## 2023-07-27 NOTE — ED Provider Notes (Signed)
 [facility]PT [facility] Emergency Department Provider Note MRN:  [id number]  Arrival date & time: 07/27/23     Chief Complaint   Headache   History of Present Illness   Kristin Salazar is a 33 y.o. year-old female with a history of headaches presenting to the ED with chief complaint of headache.  Headache similar to prior headaches starting at 6 AM yesterday, not going away.  Has tried Motrin at home without effect.  Sometimes requires some assistance at urgent cares for headaches, usually they give a pain shot of some kind.  Denies numbness or weakness to the arms or legs, no fever, no vision loss, location of headache is frontal and occipital, some neck pain, consistent with prior headaches.  This headache happening a bit earlier than normal, usually once a month but this is only 18 days from the last headache.  Review of Systems  A thorough review of systems was obtained and all systems are negative except as noted in the HPI and PMH.   Patient's Health History    Past Medical History:  Diagnosis Date   Anxiety    Tension headache     Past Surgical History:  Procedure Laterality Date   APPENDECTOMY     CESAREAN SECTION     GASTRIC BYPASS     NASAL SINUS SURGERY      History reviewed. No pertinent family history.  Social History   Socioeconomic History   Marital status: Single    Spouse name: Not on file   Number of children: Not on file   Years of education: Not on file   Highest education level: Not on file  Occupational History   Not on file  Tobacco Use   Smoking status: Never   Smokeless tobacco: Never  Vaping Use   Vaping status: Never Used  Substance and Sexual Activity   Alcohol use: No   Drug use: No   Sexual activity: Not on file  Other Topics Concern   Not on file  Social History Narrative   Not on file   Social Drivers of Health   Financial Resource Strain: Not on file  Food Insecurity: Not on file  Transportation Needs:  Not on file  Physical Activity: Not on file  Stress: Not on file  Social Connections: Not on file  Intimate Partner Violence: Not on file     Physical Exam   Vitals:   07/27/23 0228 07/27/23 0229  BP:  121/88  Pulse:  68  Resp:  17  Temp: 97.7 F (36.5 C) 97.7 F (36.5 C)  SpO2:  100%    CONSTITUTIONAL: Well-appearing, NAD NEURO/PSYCH:  Alert and oriented x 3, normal and symmetric strength and sensation, normal coordination, normal speech EYES:  eyes equal and reactive ENT/NECK:  no LAD, no JVD CARDIO: Regular rate, well-perfused, normal S1 and S2 PULM:  CTAB no wheezing or rhonchi GI/GU:  non-distended, non-tender MSK/SPINE:  No gross deformities, no edema SKIN:  no rash, atraumatic   *Additional and/or pertinent findings included in MDM below  Diagnostic and Interventional Summary    EKG Interpretation Date/Time:    Ventricular Rate:    PR Interval:    QRS Duration:    QT Interval:    QTC Calculation:   R Axis:      Text Interpretation:         Labs Reviewed - No data to display  No orders to display    Medications  naproxen (NAPROSYN) tablet 500 mg (  500 mg Oral Given 07/27/23 0141)  haloperidol lactate (HALDOL) injection 5 mg (5 mg Intramuscular Given 07/27/23 0142)     Procedures  /  Critical Care Procedures  ED Course and Medical Decision Making  Initial Impression and Ddx Headache, long history of the same, headache is similar to prior headaches, reassuring exam, providing symptomatic management and will reassess.  Currently without indication for imaging or diagnostics.  Past medical/surgical history that increases complexity of ED encounter: Frequent headaches  Interpretation of Diagnostics Laboratory and/or imaging options to aid in the diagnosis/care of the patient were considered.  After careful history and physical examination, it was determined that there was no indication for diagnostics at this time.  Patient Reassessment and Ultimate  Disposition/Management     Headache resolved after medications above, reassuring repeat exam, appropriate for discharge.  Patient management required discussion with the following services or consulting groups:  None  Complexity of Problems Addressed Acute complicated illness or Injury  Additional Data Reviewed and Analyzed Further history obtained from: None  Additional Factors Impacting ED Encounter Risk Consideration of hospitalization  Merrick Abe. Harless Lien, MD [facility] Emergency Medicine [facility] [email or web address]  Final Clinical Impressions(s) / ED Diagnoses     ICD-10-CM   1. Acute nonintractable headache, unspecified headache type  R51.9 Ambulatory referral to Neurology      ED Discharge Orders          Ordered    Ambulatory referral to Neurology       Comments: An appointment is requested in approximately: 2 weeks   07/27/23 0248             Discharge Instructions Discussed with and Provided to Patient:     Discharge Instructions      You were evaluated in the Emergency Department and after careful evaluation, we did not find any emergent condition requiring admission or further testing in the hospital.  Your exam/testing today is overall reassuring.  Recommend follow-up with neurology to discuss your headaches.  Please return to the Emergency Department if you experience any worsening of your condition.   Thank you for allowing us  to be a part of your care.       Edson Graces, MD 07/27/23 [phone]

## 2023-07-27 NOTE — Discharge Instructions (Addendum)
 You were evaluated in the Emergency Department and after careful evaluation, we did not find any emergent condition requiring admission or further testing in the hospital.  Your exam/testing today is overall reassuring.  Recommend follow-up with neurology to discuss your headaches.  Please return to the Emergency Department if you experience any worsening of your condition.   Thank you for allowing us  to be a part of your care.

## 2023-08-09 ENCOUNTER — Ambulatory Visit: Admitting: Neurology

## 2023-08-09 ENCOUNTER — Encounter: Payer: Self-pay | Admitting: Neurology

## 2023-08-09 VITALS — BP 129/84 | HR 67 | Ht 66.0 in | Wt 148.4 lb

## 2023-08-09 DIAGNOSIS — G43909 Migraine, unspecified, not intractable, without status migrainosus: Secondary | ICD-10-CM
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
## 2023-08-09 NOTE — Patient Instructions (Signed)
 It was nice to meet you today.   As discussed, your headaches are likely due to a combination of factors.   Here is what we discussed today and my recommendations for you:   Please remember, common headache triggers are: sleep deprivation, dehydration, overheating, stress, hypoglycemia or skipping meals and blood sugar fluctuations, excessive pain medications or excessive alcohol use or caffeine withdrawal. Some people have food triggers such as aged cheese, orange juice or chocolate, especially dark chocolate, or MSG (monosodium glutamate). Try to avoid these headache triggers as much possible. It may be helpful to keep a headache diary to figure out what makes your headaches worse or brings them on and what alleviates them. Some people report headache onset after exercise but studies have shown that regular exercise may actually prevent headaches from coming. If you have exercise-induced headaches, please make sure that you drink plenty of fluid before and after exercising and that you do not over do it and do not overheat. Please continue to limit your caffeine, as caffeine can drive headaches.  We will do a brain scan, called MRI and call you with the test results. We will have to schedule you for this on a separate date. This test requires authorization from your insurance, and we will take care of the insurance process. For headache acute management, I suggest Maxalt orally disintegrating tab, 5 mg: take 1 pill early on when you suspect a migraine attack come on. You may take another pill after 2 hours, no more than 2 pills in 24 hours, no more than 3 pills a week. Most people who take triptans do not have any serious side-effects. However, they can cause drowsiness (remember to not drive or use heavy machinery when drowsy), nausea, dizziness, dry mouth. Less common side effects include strange sensations, such as tightness in your chest or throat, tingling, flushing, and feelings of heaviness or  pressure in areas such as the face, limbs, and chest. These in the chest can mimic heart related pain (angina) and may cause alarm, but usually these sensations are not harmful or a sign of a heart attack. However, if you develop intense chest pain or sensations of discomfort, you should stop taking your medication and consult with me or your PCP or go to the nearest urgent care facility or ER or call 911.   We will plan a follow up in about 6 months.  Please establish with a new primary care or see your previous primary care provider. Please follow-up with your eye doctor or establish with a new optometrist or ophthalmologist of your choosing as strain on the eyes can cause headaches as well.

## 2023-08-30 ENCOUNTER — Telehealth: Payer: Self-pay | Admitting: Neurology

## 2023-08-30 MED ORDER — RIZATRIPTAN BENZOATE 5 MG PO TBDP: ORAL_TABLET | ORAL | 2 refills | Status: AC

## 2023-08-30 NOTE — Telephone Encounter (Signed)
 I called pt and relayed so sorry about that. Not sure what happened.  Will order maxalt  (instructed use), will order MRI, authorize thru insurance then facility to call you.  She appreciated call back.

## 2023-08-30 NOTE — Addendum Note (Signed)
 Addended by: Kadasia Kassing on: 08/30/2023 02:20 PM   Modules accepted: Orders

## 2023-08-30 NOTE — Telephone Encounter (Signed)
 Rx for maxalt  signed. Thank you for putting it in.

## 2023-08-30 NOTE — Telephone Encounter (Signed)
 Pt called stating that she requested Medication for her headache . But have not received anything yet . Pt has been having really bad headaches all weekend   Pt also stated that she was told she needed a cat scan but never was called to scheduled that appt.Pt would like call from Provider .

## 2023-09-10 ENCOUNTER — Telehealth: Payer: Self-pay | Admitting: Neurology

## 2023-09-10 NOTE — Telephone Encounter (Signed)
 [facility]: [id number] exp. 09/10/23-11/08/23 sent to GI [phone]

## 2023-09-30 ENCOUNTER — Ambulatory Visit

## 2023-09-30 VITALS — BP 121/72 | HR 77 | Ht 66.0 in | Wt 145.0 lb

## 2023-09-30 DIAGNOSIS — Z3201 Encounter for pregnancy test, result positive: Secondary | ICD-10-CM

## 2023-09-30 DIAGNOSIS — N926 Irregular menstruation, unspecified: Secondary | ICD-10-CM

## 2023-09-30 LAB — POCT URINE PREGNANCY: Preg Test, Ur: POSITIVE — AB
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
## 2023-10-05 ENCOUNTER — Inpatient Hospital Stay (HOSPITAL_COMMUNITY)
Admission: AD | Admit: 2023-10-05 | Discharge: 2023-10-05 | Disposition: A | Discharge status: Home / Self Care | Attending: Obstetrics & Gynecology | Admitting: Obstetrics & Gynecology

## 2023-10-05 ENCOUNTER — Other Ambulatory Visit: Payer: Self-pay

## 2023-10-05 ENCOUNTER — Inpatient Hospital Stay (HOSPITAL_COMMUNITY)

## 2023-10-05 DIAGNOSIS — O3680X0 Pregnancy with inconclusive fetal viability, not applicable or unspecified: Secondary | ICD-10-CM | POA: Diagnosis not present

## 2023-10-05 DIAGNOSIS — Z3A00 Weeks of gestation of pregnancy not specified: Secondary | ICD-10-CM | POA: Diagnosis not present

## 2023-10-05 DIAGNOSIS — O26851 Spotting complicating pregnancy, first trimester: Secondary | ICD-10-CM | POA: Diagnosis not present

## 2023-10-05 DIAGNOSIS — O26891 Other specified pregnancy related conditions, first trimester: Secondary | ICD-10-CM | POA: Diagnosis not present

## 2023-10-05 DIAGNOSIS — N939 Abnormal uterine and vaginal bleeding, unspecified: Secondary | ICD-10-CM | POA: Diagnosis not present

## 2023-10-05 DIAGNOSIS — R7989 Other specified abnormal findings of blood chemistry: Secondary | ICD-10-CM | POA: Diagnosis not present

## 2023-10-05 DIAGNOSIS — Z3A01 Less than 8 weeks gestation of pregnancy: Secondary | ICD-10-CM | POA: Insufficient documentation

## 2023-10-05 LAB — CBC
HCT: 33.5 % — ABNORMAL LOW (ref 36.0–46.0)
Hemoglobin: 11.3 g/dL — ABNORMAL LOW (ref 12.0–15.0)
MCH: 31.8 pg (ref 26.0–34.0)
MCHC: 33.7 g/dL (ref 30.0–36.0)
MCV: 94.4 fL (ref 80.0–100.0)
Platelets: 278 10*3/uL (ref 150–400)
RBC: 3.55 MIL/uL — ABNORMAL LOW (ref 3.87–5.11)
RDW: 12.5 % (ref 11.5–15.5)
WBC: 5.7 10*3/uL (ref 4.0–10.5)
nRBC: 0 % (ref 0.0–0.2)

## 2023-10-05 LAB — URINALYSIS, ROUTINE W REFLEX MICROSCOPIC
Bacteria, UA: NONE SEEN
Bilirubin Urine: NEGATIVE
Glucose, UA: NEGATIVE mg/dL
Ketones, ur: NEGATIVE mg/dL
Leukocytes,Ua: NEGATIVE
Nitrite: NEGATIVE
Protein, ur: NEGATIVE mg/dL
Specific Gravity, Urine: 1.02 (ref 1.005–1.030)
pH: 5 (ref 5.0–8.0)

## 2023-10-05 LAB — ABO/RH: ABO/RH(D): O POS

## 2023-10-05 LAB — WET PREP, GENITAL
Clue Cells Wet Prep HPF POC: NONE SEEN
Sperm: NONE SEEN
Trich, Wet Prep: NONE SEEN
WBC, Wet Prep HPF POC: <10 (ref <10)
Yeast Wet Prep HPF POC: NONE SEEN

## 2023-10-05 LAB — HCG, QUANTITATIVE, PREGNANCY: hCG, Beta Chain, Quant, S: 395 m[IU]/mL — ABNORMAL HIGH (ref <5)

## 2023-10-05 NOTE — MAU Note (Signed)
 Kristin Salazar is a 33 y.o. at [redacted]w[redacted]d here in [facility]U reporting: she's having abdominal cramping and had dark pinkish blood with wiping that began one hour ago.  Denies recent intercourse.  LMP: 08/23/2023 Onset of complaint: today  Pain score: 3 Vitals:   10/05/23 1455  BP: (!) 107/57  Pulse: 87  Resp: 18  Temp: 99.1 F (37.3 C)  SpO2: 100%     FHT: NA  Lab orders placed from triage: UA

## 2023-10-05 NOTE — MAU Provider Note (Signed)
 S Kristin Salazar is a 33 y.o. [phone] patient who presents to MAU today with complaint of dark vaginal bleeding after she wipes in the BR that started ~ 1 hour ago. She denies any heavy VB. She had a documented +UPT in the office on 09/30/23 and was scheduled for initial early ultrasound on 10/07/23    O BP (!) 107/57 (BP Location: Right Arm)   Pulse 87   Temp 99.1 F (37.3 C) (Oral)   Resp 18   Ht 5' 6 (1.676 m)   Wt 67 kg   LMP 08/23/2023 (Exact Date)   SpO2 100%   BMI 23.86 kg/m  Physical Exam Vitals and nursing note reviewed.  Constitutional:      General: She is not in acute distress.    Appearance: She is well-developed. She is not ill-appearing.  HENT:     Head: Normocephalic.     Nose: Nose normal.     Mouth/Throat:     Mouth: Mucous membranes are moist.   Cardiovascular:     Rate and Rhythm: Normal rate.  Pulmonary:     Effort: Pulmonary effort is normal.  Abdominal:     Palpations: Abdomen is soft.   Musculoskeletal:        General: Normal range of motion.     Cervical back: Normal range of motion.   Skin:    General: Skin is warm.   Neurological:     Mental Status: She is alert and oriented to person, place, and time.   Psychiatric:        Mood and Affect: Mood normal.        Behavior: Behavior normal.     MDM  HIGH  Vaginal bleeding/ abdominal cramping in early pregnacy CBC: unremarkable HCG Quant: 395 (Will need a repeat Stat HCG in 48 hours to trend levels) ABO: O Positive OB Ultrasound: Unable to verify an IUP at this time and there is a concern for an underlying molar pregnancy- will plan to trend the HCG levels with a f/u ultrasound to confirm clinical diagnosis Vaginal Swabs: negative , GC pending UA: no evidence of infection   - Pregnancy of unknown anatomical location at this time, repeat HCG levels in 48 hours as d/w Dr Eldonna ( Attending MD)  Differential diagnosis considered for 1st trimester vaginal bleeding includes  but is not limited to: ectopic pregnancy, complete spontaneous abortion, incomplete abortion, missed abortion, threatened abortion, embryonic/fetal demise, cervical insufficiency, cervical or vaginal disorder    Orders Placed This Encounter  Procedures   Wet prep, genital    Standing Status:   Standing    Number of Occurrences:   1   US  OB LESS THAN 14 WEEKS WITH OB TRANSVAGINAL    Standing Status:   Standing    Number of Occurrences:   1    Symptom/Reason for Exam:   Pregnancy, location unknown [[id number]]   Urinalysis, Routine w reflex microscopic -Urine, Clean Catch    Standing Status:   Standing    Number of Occurrences:   1    Specimen Source:   Urine, Clean Catch [[id number]]   CBC    Standing Status:   Standing    Number of Occurrences:   1   hCG, quantitative, pregnancy    Standing Status:   Standing    Number of Occurrences:   1   ABO/Rh    Standing Status:   Standing    Number of Occurrences:   1  Results for orders placed or performed during the hospital encounter of 10/05/23 (from the past 24 hours)  ABO/Rh     Status: None   Collection Time: 10/05/23  3:18 PM  Result Value Ref Range   ABO/RH(D) O POS    No rh immune globuloin      NOT A RH IMMUNE GLOBULIN CANDIDATE, PT RH POSITIVE Performed at [facility], [address], [place], [place] [address]   CBC     Status: Abnormal   Collection Time: 10/05/23  3:19 PM  Result Value Ref Range   WBC 5.7 4.0 - 10.5 K/uL   RBC 3.55 (L) 3.87 - 5.11 MIL/uL   Hemoglobin 11.3 (L) 12.0 - 15.0 g/dL   HCT 66.4 (L) 63.9 - 53.9 %   MCV 94.4 80.0 - 100.0 fL   MCH 31.8 26.0 - 34.0 pg   MCHC 33.7 30.0 - 36.0 g/dL   RDW 87.4 88.4 - 84.4 %   Platelets 278 150 - 400 K/uL   nRBC 0.0 0.0 - 0.2 %  hCG, quantitative, pregnancy     Status: Abnormal   Collection Time: 10/05/23  3:19 PM  Result Value Ref Range   hCG, Beta Chain, Quant, S 395 (H) <5 mIU/mL  Urinalysis, Routine w reflex microscopic -Urine, Clean Catch     Status:  Abnormal   Collection Time: 10/05/23  3:23 PM  Result Value Ref Range   Color, Urine YELLOW YELLOW   APPearance CLEAR CLEAR   Specific Gravity, Urine 1.020 1.005 - 1.030   pH 5.0 5.0 - 8.0   Glucose, UA NEGATIVE NEGATIVE mg/dL   Hgb urine dipstick SMALL (A) NEGATIVE   Bilirubin Urine NEGATIVE NEGATIVE   Ketones, ur NEGATIVE NEGATIVE mg/dL   Protein, ur NEGATIVE NEGATIVE mg/dL   Nitrite NEGATIVE NEGATIVE   Leukocytes,Ua NEGATIVE NEGATIVE   RBC / HPF 0-5 0 - 5 RBC/hpf   WBC, UA 0-5 0 - 5 WBC/hpf   Bacteria, UA NONE SEEN NONE SEEN   Squamous Epithelial / HPF 0-5 0 - 5 /HPF   Mucus PRESENT   Wet prep, genital     Status: None   Collection Time: 10/05/23  3:31 PM   Specimen: PATH Cytology Cervicovaginal Ancillary Only  Result Value Ref Range   Yeast Wet Prep HPF POC NONE SEEN NONE SEEN   Trich, Wet Prep NONE SEEN NONE SEEN   Clue Cells Wet Prep HPF POC NONE SEEN NONE SEEN   WBC, Wet Prep HPF POC <10 <10   Sperm NONE SEEN       Narrative & Impression  CLINICAL DATA:  [id number] Pregnancy, location unknown [id number]   EXAM: OBSTETRIC <14 WK US  AND TRANSVAGINAL OB US    TECHNIQUE: Both transabdominal and transvaginal ultrasound examinations were performed for complete evaluation of the gestation as well as the maternal uterus, adnexal regions, and pelvic cul-de-sac. Transvaginal technique was performed to assess early pregnancy.   COMPARISON:  Pelvic ultrasound report, October 09, 2016   FINDINGS: Intrauterine gestational sac: Not present, at this time.   Yolk sac:  Not present, at this time.   Fetal Pole:  Not present, at this time.   Subchorionic hemorrhage:  None visualized.   Maternal uterus/adnexae: Nonenlarged uterus. Heterogeneously thickened endometrium measuring 2.5 cm with regions of cystic change. The ovaries are also not enlarged. The right ovary measures 3.3 x 1.8 x 1.7 cm. The left ovary measures 3 x 2.4 x 1.9 cm. No free pelvic fluid.   IMPRESSION:  No  intrauterine pregnancy. In the setting of a positive beta HCG, differential considerations include an early pregnancy, complete miscarriage, or an unseen ectopic pregnancy. Given the appearance of the endometrium, an underlying molar pregnancy also remains in the differential. Serial beta hCG and sonographic follow-up should be obtained, as clinically warranted.     Electronically Signed   By: Rogelia Myers M.D.   On: 10/05/2023 17:02      I have reviewed the patient chart and performed the physical exam . I have ordered & interpreted the lab results and reviewed and interpreted the ultrasound images and agree with the radiologist findings   A/P as described below.  Counseling and education provided and patient agreeable  with plan as described below. Verbalized understanding.    ASSESSMENT Medical screening exam complete  1. Elevated serum hCG (Primary)  2. Pregnancy of unknown anatomic location  3. Vaginal bleeding     PLAN Future Appointments  Date Time Provider Department Center  10/07/2023  8:30 AM [facility] NURSE [facility]  10/07/2023 11:00 AM [facility]- US  1 [facility]  [facility]  10/28/2023 12:30 PM GI-315 MR 1 GI-315MRI GI-315 [address]. [facility]  11/04/2023  9:15 AM [facility] INTAKE [facility] None  11/18/2023  8:10 AM Abigail, Rollo DASEN, MD [facility]  02/08/2024  7:45 AM Buck Saucer, MD [facility] None    Discharge from MAU in stable condition  Repeat HCG quant scheduled for 10/07/23  See AVS for full description of educational information and instructions provided to the patient at time of discharge   Warning signs for worsening condition that would warrant emergency follow-up discussed Patient may return to MAU as needed   Littie Olam LABOR, NP 10/05/2023 5:15 PM

## 2023-10-06 LAB — GC/CHLAMYDIA PROBE AMP (~~LOC~~) NOT AT ARMC
Chlamydia: NEGATIVE
Comment: NEGATIVE
Comment: NORMAL
Neisseria Gonorrhea: NEGATIVE

## 2023-10-07 ENCOUNTER — Ambulatory Visit: Payer: Self-pay | Admitting: Obstetrics and Gynecology

## 2023-10-07 ENCOUNTER — Telehealth: Payer: Self-pay

## 2023-10-07 ENCOUNTER — Other Ambulatory Visit

## 2023-10-07 ENCOUNTER — Other Ambulatory Visit: Payer: Self-pay | Admitting: Obstetrics and Gynecology

## 2023-10-07 ENCOUNTER — Ambulatory Visit

## 2023-10-07 VITALS — BP 139/87 | HR 77 | Ht 66.0 in | Wt 149.0 lb

## 2023-10-07 DIAGNOSIS — O26851 Spotting complicating pregnancy, first trimester: Secondary | ICD-10-CM | POA: Diagnosis not present

## 2023-10-07 DIAGNOSIS — Z3A01 Less than 8 weeks gestation of pregnancy: Secondary | ICD-10-CM

## 2023-10-07 DIAGNOSIS — N939 Abnormal uterine and vaginal bleeding, unspecified: Secondary | ICD-10-CM

## 2023-10-07 LAB — BETA HCG QUANT (REF LAB): hCG Quant: 159 m[IU]/mL

## 2023-10-07 NOTE — Telephone Encounter (Signed)
 Patient called office to discuss results. Per Dr. Cleatus review, RN discussed results of decrease in Hcg levels with patient and questions answered regarding miscarriage. Pt scheduled for follow up to have Hcg levels checked and see Dr. Erik on 7/3. RN reviewed when to notify provider and or go to MAU. Pt verbalized understanding.   [name], RN

## 2023-10-07 NOTE — Progress Notes (Signed)
 Pt presents for nurse visit for stat HCG. reports bright red bleeding that started this morning with increase in pain, took Tylenol this morning, recent visit to MAU on 6/23. Reviewed when to return to office or go to hospital. Pt verbalized understanding. Lab requisition given to patient to have completed today.  [name], RN

## 2023-10-14 ENCOUNTER — Encounter: Payer: Self-pay | Admitting: Obstetrics and Gynecology

## 2023-10-14 ENCOUNTER — Other Ambulatory Visit

## 2023-10-14 ENCOUNTER — Ambulatory Visit: Payer: Self-pay | Admitting: Obstetrics and Gynecology

## 2023-10-14 ENCOUNTER — Ambulatory Visit: Admitting: Obstetrics and Gynecology

## 2023-10-14 VITALS — BP 109/70 | HR 63 | Ht 66.0 in | Wt 143.0 lb

## 2023-10-14 DIAGNOSIS — O3680X0 Pregnancy with inconclusive fetal viability, not applicable or unspecified: Secondary | ICD-10-CM | POA: Diagnosis not present

## 2023-10-14 DIAGNOSIS — Z3201 Encounter for pregnancy test, result positive: Secondary | ICD-10-CM

## 2023-10-14 LAB — BETA HCG QUANT (REF LAB): hCG Quant: 98 m[IU]/mL

## 2023-10-14 NOTE — Progress Notes (Signed)
 Pt here for stat Hcg lab. RN gave lab requisition to patient to have completed at [facility], appointment with Dr. Erik to follow later this afternoon.  [name], RN

## 2023-10-18 NOTE — Telephone Encounter (Signed)
-----   Message from Kieth JAYSON Carolin sent at 10/14/2023  6:11 PM EDT ----- Please schedule lab visit for next Thursday for hcg. Thanks - KF ----- Message ----- From: Rebecka Memos Lab Results In Sent: 10/14/2023   5:21 PM EDT To: Kieth JAYSON Carolin, MD

## 2023-10-19 NOTE — Patient Instructions (Signed)
 [employer]

## 2023-10-19 NOTE — Progress Notes (Signed)
   NEW GYNECOLOGY VISIT  Subjective:  Kristin Salazar is a 33 y.o. [id number] with LMP 5/12 presenting for PUL follow up  PUL on US  6/24 bHCG trend: 395 (6/24) > 159 (6/26) > pending today Has history of unilateral tubal reversal 2016. Reports 3 early pregnancy losses where she had a positive UPT but no US  confirmed IUP in 2015, 2018 and 2021.   Notes light bleeding and one day of pain that resolved.    Objective:   Vitals:   10/14/23 1433  BP: 109/70  Pulse: 63  Weight: 143 lb (64.9 kg)  Height: 5' 6 (1.676 m)   General:  Alert, oriented and cooperative. Patient is in no acute distress.  Skin: Skin is warm and dry. No rash noted.   Cardiovascular: Normal heart rate noted  Respiratory: Normal respiratory effort, no problems with respiration noted  Abdomen: Soft, non-tender, non-distended    Assessment and Plan:  Kristin Salazar is a 33 y.o. with PUL  Reviewed early pregnancy loss vs ectopic pregnancy Plan to trend hcg weeky to < 5 Discussed [employer] for consideration of IVF given history of BTL and unilateral reversal - reviewed risk of ectopic Discussed follow up/consideration of RPL work up after she has completed follow up for this pregnancy  Future Appointments  Date Time Provider Department Center  10/28/2023 12:30 PM GI-315 MR 1 GI-315MRI GI-315 [address]. WE  02/08/2024  7:45 AM Buck Saucer, MD [facility] None   Kieth JAYSON Carolin, MD

## 2023-10-20 ENCOUNTER — Other Ambulatory Visit: Payer: Self-pay

## 2023-10-20 DIAGNOSIS — N939 Abnormal uterine and vaginal bleeding, unspecified: Secondary | ICD-10-CM

## 2023-10-21 ENCOUNTER — Other Ambulatory Visit

## 2023-10-22 ENCOUNTER — Ambulatory Visit: Payer: Self-pay | Admitting: Obstetrics and Gynecology

## 2023-10-22 ENCOUNTER — Telehealth: Payer: Self-pay

## 2023-10-22 ENCOUNTER — Inpatient Hospital Stay (HOSPITAL_COMMUNITY)

## 2023-10-22 ENCOUNTER — Inpatient Hospital Stay (HOSPITAL_COMMUNITY)
Admission: AD | Admit: 2023-10-22 | Discharge: 2023-10-22 | Disposition: A | Discharge status: Home / Self Care | Attending: Obstetrics & Gynecology | Admitting: Obstetrics & Gynecology

## 2023-10-22 DIAGNOSIS — O0091 Unspecified ectopic pregnancy with intrauterine pregnancy: Secondary | ICD-10-CM | POA: Diagnosis not present

## 2023-10-22 DIAGNOSIS — O008 Other ectopic pregnancy without intrauterine pregnancy: Secondary | ICD-10-CM | POA: Diagnosis present

## 2023-10-22 DIAGNOSIS — Z3A08 8 weeks gestation of pregnancy: Secondary | ICD-10-CM | POA: Diagnosis not present

## 2023-10-22 LAB — COMPREHENSIVE METABOLIC PANEL WITH GFR
ALT: 26 U/L (ref 0–44)
AST: 25 U/L (ref 15–41)
Albumin: 3.7 g/dL (ref 3.5–5.0)
Alkaline Phosphatase: 59 U/L (ref 38–126)
Anion gap: 8 (ref 5–15)
BUN: 8 mg/dL (ref 6–20)
CO2: 23 mmol/L (ref 22–32)
Calcium: 9 mg/dL (ref 8.9–10.3)
Chloride: 106 mmol/L (ref 98–111)
Creatinine, Ser: 0.65 mg/dL (ref 0.44–1.00)
GFR, Estimated: >60 mL/min (ref >60)
Glucose, Bld: 64 mg/dL — ABNORMAL LOW (ref 70–99)
Potassium: 3.6 mmol/L (ref 3.5–5.1)
Sodium: 137 mmol/L (ref 135–145)
Total Bilirubin: 0.3 mg/dL (ref 0.0–1.2)
Total Protein: 6.2 g/dL — ABNORMAL LOW (ref 6.5–8.1)

## 2023-10-22 LAB — CBC
HCT: 35.2 % — ABNORMAL LOW (ref 36.0–46.0)
Hemoglobin: 12 g/dL (ref 12.0–15.0)
MCH: 31.6 pg (ref 26.0–34.0)
MCHC: 34.1 g/dL (ref 30.0–36.0)
MCV: 92.6 fL (ref 80.0–100.0)
Platelets: 258 K/uL (ref 150–400)
RBC: 3.8 MIL/uL — ABNORMAL LOW (ref 3.87–5.11)
RDW: 11.9 % (ref 11.5–15.5)
WBC: 4.7 K/uL (ref 4.0–10.5)
nRBC: 0 % (ref 0.0–0.2)

## 2023-10-22 LAB — BETA HCG QUANT (REF LAB): hCG Quant: 109 m[IU]/mL

## 2023-10-22 LAB — HCG, QUANTITATIVE, PREGNANCY: hCG, Beta Chain, Quant, S: 141 m[IU]/mL — ABNORMAL HIGH (ref <5)

## 2023-10-22 MED ORDER — METHOTREXATE FOR ECTOPIC PREGNANCY
50.0000 mg/m2 | Freq: Once | INTRAMUSCULAR | Status: AC
  Administered 2023-10-22: 87.5 mg via INTRAMUSCULAR
  Filled 2023-10-22: qty 3.5

## 2023-10-22 MED ORDER — CYCLOBENZAPRINE HCL 10 MG PO TABS: 10.0000 mg | ORAL_TABLET | Freq: Two times a day (BID) | ORAL | 0 refills | Status: AC | PRN

## 2023-10-22 NOTE — Discharge Instructions (Signed)
 The risks of methotrexate  were reviewed including failure requiring repeat dosing or eventual surgery. She understands that methotrexate  involves frequent return visits to monitor lab values and that she remains at risk of ectopic rupture until her beta is less than assay. ?The patient opts to proceed with methotrexate .  She has no history of hepatic or renal dysfunction, has normal BUN/Cr/LFT's/platelets.  She is felt to be reliable for follow-up. Side effects of photosensitivity & GI upset were discussed.  She knows to avoid direct sunlight and abstain from alcohol, NSAIDs and sexual intercourse for two weeks. She was counseled to discontinue any MVI with folic acid. ?She understands to follow up on D4 (7/14) and D7 (7/17) for repeat BHCG and was given the instruction sheet. ?Strict ectopic precautions were reviewed, the patient knows to call with any abdominal pain, vomiting, fainting, or any concerns with her health.  Day 0/1 Day 4 Day 7  Sunday Wednesday Saturday  Monday Thursday Sunday  Tuesday Friday Monday  Wednesday Saturday Tuesday  Thursday Sunday Wednesday  Friday Monday Thursday  Saturday Tuesday Friday

## 2023-10-22 NOTE — MAU Note (Signed)
 Pt sent in to get checked for ectopic pregnancy. Had HCG drawn yesterday. Stated she is starting to have cramping in her lower abd that started this morning. Denies any vag bleeding or discharge.

## 2023-10-22 NOTE — MAU Provider Note (Cosign Needed Addendum)
 Chief Complaint:  Ectopic Pregnancy   HPI     Kristin Salazar is a 33 y.o. [id number] at [redacted]w[redacted]d who presents to maternity admissions sent from the office for methotrexate  due to ectopic pregnancy.  Patient was seen in MAU on 6/24 with pregnancy of unknown location . Follow up HCG's were ordered and they were down trending. Yesterday's HCG was elevated and now patient meets criteria for ectopic pregnancy and methotrexate  management. Patient denies any VB but reports mild lower abdominal cramping resolved by Tylenol.   Past Medical History:  Diagnosis Date   Anxiety    Tension headache    OB History  Gravida Para Term Preterm AB Living  5 2 2  2 2   SAB IAB Ectopic Multiple Live Births      2    # Outcome Date GA Lbr Len/2nd Weight Sex Type Anes PTL Lv  5 Current           4 Term 09/08/14 [redacted]w[redacted]d   M CS-LTranv   LIV  3 Term 12/07/07 [redacted]w[redacted]d   F Vag-Spont   LIV  2 AB           1 AB             Obstetric Comments  Pt reported SAB in 2016, unsure of the month. Pt reported SAB 2018, unsure of month   Past Surgical History:  Procedure Laterality Date   APPENDECTOMY     CESAREAN SECTION     GASTRIC BYPASS     NASAL SINUS SURGERY     Tubal reversal  2016   Family History  Problem Relation Age of Onset   Migraines Mother    Social History   Tobacco Use   Smoking status: Never   Smokeless tobacco: Never  Vaping Use   Vaping status: Never Used  Substance Use Topics   Alcohol use: No   Drug use: No   No Known Allergies Medications Prior to Admission  Medication Sig Dispense Refill Last Dose/Taking   acetaminophen (TYLENOL) 500 MG tablet Take 500 mg by mouth every 6 (six) hours as needed.      buPROPion (WELLBUTRIN XL) 300 MG 24 hr tablet Take 300 mg by mouth daily.      desvenlafaxine (PRISTIQ) 25 MG 24 hr tablet Take 25 mg by mouth daily.      Prenatal Vit-Fe Fumarate-FA (PRENATAL PO) Take 1 tablet by mouth daily. (Patient not taking: Reported on 10/14/2023)      rizatriptan   (MAXALT -MLT) 5 MG disintegrating tablet Take 1 tablet onset migraine, another 2 hours later if needed. (Max 2 tabs/24hrs). Only 3 days a week. 10 tablet 2    traZODone (DESYREL) 150 MG tablet Take 100 mg by mouth at bedtime.       I have reviewed patient's Past Medical Hx, Surgical Hx, Family Hx, Social Hx, medications and allergies.   ROS  Pertinent items noted in HPI and remainder of comprehensive ROS otherwise negative.   PHYSICAL EXAM  Patient Vitals for the past 24 hrs:  BP Temp Pulse Resp  10/22/23 1304 111/64 99 F (37.2 C) 68 18    Constitutional: Well-developed, well-nourished female in no acute distress.  Cardiovascular: normal rate & rhythm, warm and well-perfused Respiratory: normal effort, no problems with respiration noted GI: Abd soft, non-tender MS: Extremities nontender, no edema, normal ROM Neurologic: Alert and oriented x 4.      Labs:  Results for orders placed or performed during the hospital encounter of 10/22/23 (  from the past 24 hours)  hCG, quantitative, pregnancy     Status: Abnormal   Collection Time: 10/22/23  1:48 PM  Result Value Ref Range   hCG, Beta Chain, Quant, S 141 (H) <5 mIU/mL  Comprehensive metabolic panel     Status: Abnormal   Collection Time: 10/22/23  1:48 PM  Result Value Ref Range   Sodium 137 135 - 145 mmol/L   Potassium 3.6 3.5 - 5.1 mmol/L   Chloride 106 98 - 111 mmol/L   CO2 23 22 - 32 mmol/L   Glucose, Bld 64 (L) 70 - 99 mg/dL   BUN 8 6 - 20 mg/dL   Creatinine, Ser 9.34 0.44 - 1.00 mg/dL   Calcium 9.0 8.9 - 89.6 mg/dL   Total Protein 6.2 (L) 6.5 - 8.1 g/dL   Albumin 3.7 3.5 - 5.0 g/dL   AST 25 15 - 41 U/L   ALT 26 0 - 44 U/L   Alkaline Phosphatase 59 38 - 126 U/L   Total Bilirubin 0.3 0.0 - 1.2 mg/dL   GFR, Estimated >39 >39 mL/min   Anion gap 8 5 - 15  CBC     Status: Abnormal   Collection Time: 10/22/23  1:48 PM  Result Value Ref Range   WBC 4.7 4.0 - 10.5 K/uL   RBC 3.80 (L) 3.87 - 5.11 MIL/uL   Hemoglobin  12.0 12.0 - 15.0 g/dL   HCT 64.7 (L) 63.9 - 53.9 %   MCV 92.6 80.0 - 100.0 fL   MCH 31.6 26.0 - 34.0 pg   MCHC 34.1 30.0 - 36.0 g/dL   RDW 88.0 88.4 - 84.4 %   Platelets 258 150 - 400 K/uL   nRBC 0.0 0.0 - 0.2 %       HCG: 141 on 10/22/23 (Increased)  Lab Results  Component Value Date   HCGQUANT 109 10/21/2023   HCGQUANT 98 10/14/2023   HCGQUANT 159 10/07/2023   HCG 395 on 10/05/23    Narrative & Impression  CLINICAL DATA:  [id number] Pregnancy, location unknown [id number]   EXAM: OBSTETRIC <14 WK US  AND TRANSVAGINAL OB US    TECHNIQUE: Both transabdominal and transvaginal ultrasound examinations were performed for complete evaluation of the gestation as well as the maternal uterus, adnexal regions, and pelvic cul-de-sac. Transvaginal technique was performed to assess early pregnancy.   COMPARISON:  Pelvic ultrasound report, October 09, 2016   FINDINGS: Intrauterine gestational sac: Not present, at this time.   Yolk sac:  Not present, at this time.   Fetal Pole:  Not present, at this time.   Subchorionic hemorrhage:  None visualized.   Maternal uterus/adnexae: Nonenlarged uterus. Heterogeneously thickened endometrium measuring 2.5 cm with regions of cystic change. The ovaries are also not enlarged. The right ovary measures 3.3 x 1.8 x 1.7 cm. The left ovary measures 3 x 2.4 x 1.9 cm. No free pelvic fluid.   IMPRESSION: No intrauterine pregnancy. In the setting of a positive beta HCG, differential considerations include an early pregnancy, complete miscarriage, or an unseen ectopic pregnancy. Given the appearance of the endometrium, an underlying molar pregnancy also remains in the differential. Serial beta hCG and sonographic follow-up should be obtained, as clinically warranted.     Electronically Signed   By: Rogelia Myers M.D.   On: 10/05/2023 17:02      MDM & MAU COURSE  MDM:  HIGH  - Pregnancy of unknown location/ ectopic pregnancy  - HCG's were down  trending, now up trending and  no IUP was visualized on ultrasound , concern for ectopic pregnancy   - Plan per Dr Erik and Dr Herchel is for methotrexate  protocol  S/R/B of methotrexate   was reviewed with the patient and the instructions were provided with appropriate follow up   @ 1757 when methotrexate  arrived from Pharmacy. Patient reported having left sided abdominal pain rating it 10/10 that had started approximately  15 minutes prior. Abdominal exam is tender in the LLQ, no guarding, no rebound - DW Dr Zina - Will send for ultrasound to r/o ruptured ectopic prior to administering the methotrexate .  @ 1910 No evidence of ruptured ectopic on ultrasound with no free fluid. Will proceed with methotrexate  as planned    MAU Course: Orders Placed This Encounter  Procedures   US  OB LESS THAN 14 WEEKS WITH OB TRANSVAGINAL   hCG, quantitative, pregnancy   Comprehensive metabolic panel   CBC   Discharge patient Discharge disposition: 01-Home or Self Care; Discharge patient date: 10/22/2023   Meds ordered this encounter  Medications   methotrexate  (for ectopic pregnancy) 25 mg/mL chemo injection   cyclobenzaprine  (FLEXERIL ) 10 MG tablet    Sig: Take 1 tablet (10 mg total) by mouth 2 (two) times daily as needed for muscle spasms.    Dispense:  20 tablet    Refill:  0    Supervising Provider:   PRATT, TANYA S [[id number]]   The risks of methotrexate  were reviewed including failure requiring repeat dosing or eventual surgery. She understands that methotrexate  involves frequent return visits to monitor lab values and that she remains at risk of ectopic rupture until her beta is less than assay. ?The patient opts to proceed with methotrexate .  She has no history of hepatic or renal dysfunction, has normal BUN/Cr/LFT's/platelets.  She is felt to be reliable for follow-up. Side effects of photosensitivity & GI upset were discussed.  She knows to avoid direct sunlight and abstain from alcohol, NSAIDs and  sexual intercourse for two weeks. She was counseled to discontinue any MVI with folic acid. ?She understands to follow up on D4 (7/14 ) and D7 (7/17) for repeat BHCG and was given the instruction sheet.  ?Strict ectopic precautions were reviewed, the patient knows to call with any abdominal pain, vomiting, fainting, or any concerns with her health.  Day 0/1 Day 4 Day 7  Sunday Wednesday Saturday  Monday Thursday Sunday  Tuesday Friday Monday  Wednesday Saturday Tuesday  Thursday Sunday Wednesday  Friday Monday Thursday  Saturday Tuesday Friday     ASSESSMENT   1. Ectopic pregnancy with intrauterine pregnancy, unspecified location     PLAN   Future Appointments  Date Time Provider Department Center  10/22/2023  6:00 PM [facility] US  1 [facility]-US  [facility]  10/25/2023  1:30 PM [facility] LAB [facility]  10/28/2023  9:30 AM [facility] LAB [facility]  10/28/2023 12:30 PM GI-315 MR 1 GI-315MRI GI-315 W. WE  02/08/2024  7:45 AM Buck Saucer, MD [facility] None     Discharge home in stable condition with return precautions.   See AVS for full description of information given to the patient including both verbal and written. Patient verbalized understanding and agrees with the plan as described above.     Follow-up Information     Center for [facility] Follow up on 10/25/2023.   Specialty: Obstetrics and Gynecology Why: Methotrexate  therapy follow-up Contact information: [address] [place]  [address] [phone]  Allergies as of 10/22/2023   No Known Allergies      Medication List     STOP taking these medications    PRENATAL PO       TAKE these medications    acetaminophen 500 MG tablet Commonly known as: TYLENOL Take 500 mg by mouth every 6 (six) hours as needed.   buPROPion 300 MG 24 hr tablet Commonly known as: WELLBUTRIN XL Take 300 mg by mouth daily.   cyclobenzaprine  10 MG  tablet Commonly known as: FLEXERIL  Take 1 tablet (10 mg total) by mouth 2 (two) times daily as needed for muscle spasms.   desvenlafaxine 25 MG 24 hr tablet Commonly known as: PRISTIQ Take 25 mg by mouth daily.   rizatriptan  5 MG disintegrating tablet Commonly known as: MAXALT -MLT Take 1 tablet onset migraine, another 2 hours later if needed. (Max 2 tabs/24hrs). Only 3 days a week.   traZODone 150 MG tablet Commonly known as: DESYREL Take 100 mg by mouth at bedtime.        Olam Dalton, MSN, [facility], Center for [employer]

## 2023-10-22 NOTE — Telephone Encounter (Signed)
 RN called patient per request of Dr. Erik to ensure patient received messages to go to MAU regarding concern for ectopic. RN spoke with patient and explained in importance of going to MAU as soon as possible. Patient reported had plans to go after work. RN re-emphasized importance of going sooner. Patient reported currently at work and does not have anyone to stand in for her. Patient verbalized understanding of instructions. Camie, CNM and Dr. Erik notified.  [name], RN

## 2023-10-22 NOTE — Telephone Encounter (Signed)
 Attempted to call patient to review bHCG results. Inappropriately rising, concerning for ectopic pregnancy esp given history of tubal reversal. Recommend she presents to MAU for treatment with methotrexate .   LVM asking patient to return our call/check mychart messages.  Kieth Carolin, MD Obstetrician & Gynecologist, [facility] for [employer], [facility]

## 2023-10-25 ENCOUNTER — Other Ambulatory Visit: Payer: Self-pay

## 2023-10-25 DIAGNOSIS — O0091 Unspecified ectopic pregnancy with intrauterine pregnancy: Secondary | ICD-10-CM

## 2023-10-25 LAB — BETA HCG QUANT (REF LAB): hCG Quant: 119 m[IU]/mL

## 2023-10-28 ENCOUNTER — Ambulatory Visit: Admission: RE | Admit: 2023-10-28 | Source: Ambulatory Visit

## 2023-10-28 ENCOUNTER — Ambulatory Visit (INDEPENDENT_AMBULATORY_CARE_PROVIDER_SITE_OTHER)

## 2023-10-28 ENCOUNTER — Other Ambulatory Visit: Payer: Self-pay

## 2023-10-28 VITALS — BP 115/62 | HR 69 | Ht 66.0 in | Wt 149.2 lb

## 2023-10-28 DIAGNOSIS — O3680X0 Pregnancy with inconclusive fetal viability, not applicable or unspecified: Secondary | ICD-10-CM

## 2023-10-28 DIAGNOSIS — O0091 Unspecified ectopic pregnancy with intrauterine pregnancy: Secondary | ICD-10-CM

## 2023-10-28 DIAGNOSIS — Z3A08 8 weeks gestation of pregnancy: Secondary | ICD-10-CM

## 2023-10-28 NOTE — Progress Notes (Unsigned)
 Pt here today for STAT Beta s/p Methotrexate  day 7 for ectopic pregnancy.  Pt denies pain and VB today.  Pt advised that she will receive a call later today with results.  Pt verbalized understanding.   Per Lola, MD- Patient with 16% drop of hcg on day 7. This is technically acceptable as its greater than or equal to 15% drop, but would like her to come back early next week for hcg check around Monday or Tuesday just to ensure its still dropping.   Pt notified of results.  Pt advised when to go to MAU for evaluation.  Pt agreed to appt on 11/02/23 at 115 for non stat.    Kameah Rawl,RN

## 2023-10-29 ENCOUNTER — Ambulatory Visit: Payer: Self-pay | Admitting: Family Medicine

## 2023-10-29 LAB — BETA HCG QUANT (REF LAB): hCG Quant: 100 m[IU]/mL

## 2023-11-01 ENCOUNTER — Other Ambulatory Visit: Payer: Self-pay

## 2023-11-01 ENCOUNTER — Other Ambulatory Visit

## 2023-11-01 DIAGNOSIS — O0091 Unspecified ectopic pregnancy with intrauterine pregnancy: Secondary | ICD-10-CM

## 2023-11-02 LAB — BETA HCG QUANT (REF LAB): hCG Quant: 46 m[IU]/mL

## 2023-11-04 ENCOUNTER — Encounter

## 2023-11-04 ENCOUNTER — Other Ambulatory Visit: Payer: Self-pay | Admitting: *Deleted

## 2023-11-04 DIAGNOSIS — O0091 Unspecified ectopic pregnancy with intrauterine pregnancy: Secondary | ICD-10-CM

## 2023-11-04 NOTE — Telephone Encounter (Signed)
-----   Message from Donnice CHRISTELLA Carolus sent at 11/02/2023  9:09 AM EDT ----- Downtrending appropriately s/p methotrexate  Clinical staff please coordinate follow up hcg in one week, trend to 0 ----- Message ----- From: Interface, [facility] Lab Results In Sent: 11/02/2023   4:35 AM EDT To: Donnice CHRISTELLA Carolus, MD

## 2023-11-04 NOTE — Telephone Encounter (Signed)
 I called Kristin Salazar and informed her of results and recommendation. She agreed to lab appointment 11/08/23 and reports having some bleeding with tissue . Advised if severe pain to go to MAU, and importance of keeping appointment to verify bhcg is dropping appropriately . We discussed if bhcg rises may need additonal methotrexate  or surgery. She voices understanding. Rock Skip PEAK

## 2023-11-08 ENCOUNTER — Other Ambulatory Visit: Payer: Self-pay

## 2023-11-08 ENCOUNTER — Other Ambulatory Visit

## 2023-11-08 DIAGNOSIS — O0091 Unspecified ectopic pregnancy with intrauterine pregnancy: Secondary | ICD-10-CM

## 2023-11-09 ENCOUNTER — Ambulatory Visit: Payer: Self-pay | Admitting: Family Medicine

## 2023-11-09 DIAGNOSIS — O3680X0 Pregnancy with inconclusive fetal viability, not applicable or unspecified: Secondary | ICD-10-CM

## 2023-11-09 LAB — BETA HCG QUANT (REF LAB): hCG Quant: 5 m[IU]/mL

## 2023-11-09 NOTE — Telephone Encounter (Signed)
-----   Message from Donnice CHRISTELLA Carolus sent at 11/09/2023  8:41 AM EDT ----- Downtrending appropriately Clinical staff please coordinate follow up hcg in one week, trend to 0, will likely be the last one she needs ----- Message ----- From: Interface, [facility] Lab Results In Sent: 11/09/2023   4:36 AM EDT To: Donnice CHRISTELLA Carolus, MD

## 2023-11-09 NOTE — Telephone Encounter (Signed)
 Called pt to inform her of Beta HCG 5 and that Dr. Lola advised a follow up beta hcg in one week to ensure level trends to 0.  Pt agreeable to lab visit on 11/15/23 at 1:15.    [place], RN

## 2023-11-15 ENCOUNTER — Other Ambulatory Visit

## 2023-11-18 ENCOUNTER — Encounter: Admitting: Obstetrics and Gynecology

## 2023-11-24 ENCOUNTER — Ambulatory Visit: Admitting: Neurology

## 2024-02-08 ENCOUNTER — Ambulatory Visit: Admitting: Neurology

## 2024-02-08 ENCOUNTER — Telehealth: Payer: Self-pay | Admitting: Neurology

## 2024-02-08 NOTE — Telephone Encounter (Signed)
 Provider Out reschedule

## 2024-02-15 ENCOUNTER — Other Ambulatory Visit: Payer: Self-pay

## 2024-02-23 ENCOUNTER — Encounter: Payer: Self-pay | Admitting: Neurology

## 2024-03-31 ENCOUNTER — Encounter (HOSPITAL_COMMUNITY): Payer: Self-pay | Admitting: Obstetrics & Gynecology

## 2024-03-31 ENCOUNTER — Inpatient Hospital Stay (HOSPITAL_COMMUNITY)

## 2024-03-31 ENCOUNTER — Encounter: Payer: Self-pay | Admitting: Obstetrics and Gynecology

## 2024-03-31 ENCOUNTER — Inpatient Hospital Stay (HOSPITAL_COMMUNITY)
Admission: AD | Admit: 2024-03-31 | Discharge: 2024-03-31 | Discharge status: Against Medical Advice | Attending: Obstetrics and Gynecology | Admitting: Obstetrics and Gynecology

## 2024-03-31 ENCOUNTER — Other Ambulatory Visit: Payer: Self-pay

## 2024-03-31 DIAGNOSIS — Z113 Encounter for screening for infections with a predominantly sexual mode of transmission: Secondary | ICD-10-CM | POA: Diagnosis present

## 2024-03-31 DIAGNOSIS — Z3201 Encounter for pregnancy test, result positive: Secondary | ICD-10-CM | POA: Insufficient documentation

## 2024-03-31 DIAGNOSIS — Z3A01 Less than 8 weeks gestation of pregnancy: Secondary | ICD-10-CM | POA: Insufficient documentation

## 2024-03-31 DIAGNOSIS — O3680X0 Pregnancy with inconclusive fetal viability, not applicable or unspecified: Secondary | ICD-10-CM | POA: Insufficient documentation

## 2024-03-31 DIAGNOSIS — R1032 Left lower quadrant pain: Secondary | ICD-10-CM | POA: Diagnosis present

## 2024-03-31 LAB — URINALYSIS, ROUTINE W REFLEX MICROSCOPIC
Bilirubin Urine: NEGATIVE
Glucose, UA: NEGATIVE mg/dL
Hgb urine dipstick: NEGATIVE
Ketones, ur: NEGATIVE mg/dL
Leukocytes,Ua: NEGATIVE
Nitrite: NEGATIVE
Protein, ur: NEGATIVE mg/dL
Specific Gravity, Urine: 1.025 (ref 1.005–1.030)
pH: 5 (ref 5.0–8.0)

## 2024-03-31 LAB — WET PREP, GENITAL
Clue Cells Wet Prep HPF POC: NONE SEEN
Sperm: NONE SEEN
Trich, Wet Prep: NONE SEEN
WBC, Wet Prep HPF POC: <10
Yeast Wet Prep HPF POC: NONE SEEN

## 2024-03-31 LAB — POCT PREGNANCY, URINE: Preg Test, Ur: POSITIVE — AB

## 2024-03-31 LAB — HCG, QUANTITATIVE, PREGNANCY: hCG, Beta Chain, Quant, S: 25347 m[IU]/mL — ABNORMAL HIGH

## 2024-03-31 NOTE — MAU Note (Signed)
 Kristin Salazar is a 33 y.o. at Unknown here in MAU reporting: +HPT yesterday. Patient reporting lower abdominal cramping that is mostly on the left lower side. Pt has hx of ectopic pregnancy so she is nervous about another ectopic. Pt had methotrexate  on 10/22/23. Denies any vaginal bleeding currently.  Patient denies any unusual vaginal discharge, vaginal odor, itching or pain. Denies any recent sexual intercourse. Has not taken any medication for cramping today.  LMP: 02/23/24 Onset of complaint: today Pain score: 7 Vitals:   03/31/24 1005  BP: (!) 106/48  Pulse: 72  Resp: 15  Temp: 98 F (36.7 C)  SpO2: 100%     FHT:n/a Lab orders placed from triage:  pregnancy test, vag swabs

## 2024-03-31 NOTE — MAU Provider Note (Signed)
 " History     [id number]  Arrival date and time: 03/31/24 9052    Chief Complaint  Patient presents with   Abdominal Pain     HPI Kristin Salazar is a 33 y.o. at [redacted]w[redacted]d who presents for abdominal cramping. Denied VB in triage.  Left AMA prior to being seen.    --/--/O POS (06/24 1518)  Past Medical History:  Diagnosis Date   Anxiety    Tension headache     Past Surgical History:  Procedure Laterality Date   APPENDECTOMY     CESAREAN SECTION     GASTRIC BYPASS     NASAL SINUS SURGERY     Tubal reversal  2016    Family History  Problem Relation Age of Onset   Migraines Mother     Social History   Socioeconomic History   Marital status: Single    Spouse name: Not on file   Number of children: Not on file   Years of education: Not on file   Highest education level: Not on file  Occupational History   Not on file  Tobacco Use   Smoking status: Never   Smokeless tobacco: Never  Vaping Use   Vaping status: Never Used  Substance and Sexual Activity   Alcohol use: No   Drug use: No   Sexual activity: Yes  Other Topics Concern   Not on file  Social History Narrative   Caffiene rare   Working: not now   Live with children 11, and 15 yr   Social Drivers of Health   Tobacco Use: Low Risk (02/18/2024)   Received from [facility]   Patient History    Smoking Tobacco Use: Never    Smokeless Tobacco Use: Never    Passive Exposure: Not on file  Financial Resource Strain: Not on file  Food Insecurity: Not on file  Transportation Needs: Not on file  Physical Activity: Not on file  Stress: Not on file  Social Connections: Not on file  Intimate Partner Violence: Not on file  Depression (EYV7-0): Not on file  Alcohol Screen: Not on file  Housing: Low Risk (08/03/2023)   Received from [facility]   Epic    What is your living situation today?: I have a steady place to live    Think about the place you live. Do you have problems with any of the  following? Choose all that apply:: Not on file  Utilities: Not on file  Health Literacy: Not on file    Allergies[1]  Medications Ordered Prior to Encounter[2]  Pertinent positives and negative per HPI, all others reviewed and negative  Physical Exam   BP (!) 106/48 (BP Location: Right Arm)   Pulse 72   Temp 98 F (36.7 C) (Oral)   Resp 15   Ht 5' 5 (1.651 m)   Wt 58 kg   LMP 02/23/2024 (Exact Date)   SpO2 100%   Breastfeeding Unknown   BMI 21.28 kg/m   Patient Vitals for the past 24 hrs:  BP Temp Temp src Pulse Resp SpO2 Height Weight  03/31/24 1005 (!) 106/48 98 F (36.7 C) Oral 72 15 100 % 5' 5 (1.651 m) 58 kg    Labs Results for orders placed or performed during the hospital encounter of 03/31/24 (from the past 24 hours)  Urinalysis, Routine w reflex microscopic -Urine, Clean Catch     Status: None   Collection Time: 03/31/24 10:12 AM  Result Value Ref Range  Color, Urine YELLOW YELLOW   APPearance CLEAR CLEAR   Specific Gravity, Urine 1.025 1.005 - 1.030   pH 5.0 5.0 - 8.0   Glucose, UA NEGATIVE NEGATIVE mg/dL   Hgb urine dipstick NEGATIVE NEGATIVE   Bilirubin Urine NEGATIVE NEGATIVE   Ketones, ur NEGATIVE NEGATIVE mg/dL   Protein, ur NEGATIVE NEGATIVE mg/dL   Nitrite NEGATIVE NEGATIVE   Leukocytes,Ua NEGATIVE NEGATIVE  Wet prep, genital     Status: None   Collection Time: 03/31/24 10:12 AM  Result Value Ref Range   Yeast Wet Prep HPF POC NONE SEEN NONE SEEN   Trich, Wet Prep NONE SEEN NONE SEEN   Clue Cells Wet Prep HPF POC NONE SEEN NONE SEEN   WBC, Wet Prep HPF POC <10 <10   Sperm NONE SEEN   Pregnancy, urine POC     Status: Abnormal   Collection Time: 03/31/24 10:13 AM  Result Value Ref Range   Preg Test, Ur POSITIVE (A) NEGATIVE    Imaging No results found.  MAU Course  Procedures  Lab Orders         Wet prep, genital         Urinalysis, Routine w reflex microscopic -Urine, Clean Catch         hCG, quantitative, pregnancy          Pregnancy, urine POC    No orders of the defined types were placed in this encounter.   Imaging Orders         US  OB LESS THAN 14 WEEKS WITH OB TRANSVAGINAL      Assessment and Plan   Left AMA prior to being seen  HCG 25,347 US  shows IUP with gestational sac and yolk sac without fetal pole or cardiac activity with small subchorionic hematomas. Wet prep and UA not indicative of infectious process  Spoke with patient over the phone about results. Recommended f/u US  in 10-14 days to confirm viability.   Patient states she will follow up with her OB provider.      Daden Mahany L Abhiraj Dozal, MD/MHA 03/31/2024 12:04 PM       [1] No Known Allergies [2]  No current facility-administered medications on file prior to encounter.   Current Outpatient Medications on File Prior to Encounter  Medication Sig Dispense Refill   buPROPion (WELLBUTRIN XL) 300 MG 24 hr tablet Take 300 mg by mouth daily.     desvenlafaxine (PRISTIQ) 25 MG 24 hr tablet Take 25 mg by mouth daily.     acetaminophen (TYLENOL) 500 MG tablet Take 500 mg by mouth every 6 (six) hours as needed.     cyclobenzaprine  (FLEXERIL ) 10 MG tablet Take 1 tablet (10 mg total) by mouth 2 (two) times daily as needed for muscle spasms. 20 tablet 0   rizatriptan  (MAXALT -MLT) 5 MG disintegrating tablet Take 1 tablet onset migraine, another 2 hours later if needed. (Max 2 tabs/24hrs). Only 3 days a week. 10 tablet 2   traZODone (DESYREL) 150 MG tablet Take 100 mg by mouth at bedtime.     "

## 2024-03-31 NOTE — Progress Notes (Signed)
 Registration called [facility] to inform staff that patient left AMA, stating never waited this long for results.

## 2024-04-03 ENCOUNTER — Other Ambulatory Visit: Payer: Self-pay | Admitting: Obstetrics and Gynecology

## 2024-04-03 DIAGNOSIS — Z3490 Encounter for supervision of normal pregnancy, unspecified, unspecified trimester: Secondary | ICD-10-CM

## 2024-04-03 LAB — GC/CHLAMYDIA PROBE AMP (~~LOC~~) NOT AT ARMC
Chlamydia: NEGATIVE
Comment: NEGATIVE
Comment: NORMAL
Neisseria Gonorrhea: NEGATIVE

## 2024-04-03 NOTE — Addendum Note (Signed)
 Addended by: ERIK FELTS on: 04/03/2024 05:52 PM   Modules accepted: Orders

## 2024-04-09 ENCOUNTER — Inpatient Hospital Stay (HOSPITAL_COMMUNITY)

## 2024-04-09 ENCOUNTER — Other Ambulatory Visit: Payer: Self-pay

## 2024-04-09 ENCOUNTER — Inpatient Hospital Stay (HOSPITAL_COMMUNITY)
Admission: AD | Admit: 2024-04-09 | Discharge: 2024-04-09 | Disposition: A | Discharge status: Home / Self Care | Attending: Obstetrics & Gynecology | Admitting: Obstetrics & Gynecology

## 2024-04-09 DIAGNOSIS — O99841 Bariatric surgery status complicating pregnancy, first trimester: Secondary | ICD-10-CM | POA: Insufficient documentation

## 2024-04-09 DIAGNOSIS — N938 Other specified abnormal uterine and vaginal bleeding: Secondary | ICD-10-CM | POA: Insufficient documentation

## 2024-04-09 DIAGNOSIS — R109 Unspecified abdominal pain: Secondary | ICD-10-CM

## 2024-04-09 DIAGNOSIS — O26891 Other specified pregnancy related conditions, first trimester: Secondary | ICD-10-CM | POA: Diagnosis present

## 2024-04-09 DIAGNOSIS — Z3A01 Less than 8 weeks gestation of pregnancy: Secondary | ICD-10-CM | POA: Diagnosis not present

## 2024-04-09 DIAGNOSIS — N920 Excessive and frequent menstruation with regular cycle: Secondary | ICD-10-CM

## 2024-04-09 DIAGNOSIS — O208 Other hemorrhage in early pregnancy: Secondary | ICD-10-CM | POA: Diagnosis not present

## 2024-04-09 LAB — URINALYSIS, ROUTINE W REFLEX MICROSCOPIC
Bilirubin Urine: NEGATIVE
Glucose, UA: NEGATIVE mg/dL
Hgb urine dipstick: NEGATIVE
Ketones, ur: NEGATIVE mg/dL
Nitrite: NEGATIVE
Protein, ur: NEGATIVE mg/dL
Specific Gravity, Urine: 1.027 (ref 1.005–1.030)
pH: 6 (ref 5.0–8.0)

## 2024-04-09 LAB — HCG, QUANTITATIVE, PREGNANCY: hCG, Beta Chain, Quant, S: 58054 m[IU]/mL — ABNORMAL HIGH

## 2024-04-09 NOTE — MAU Provider Note (Signed)
 " History     CSN: [id number]  Arrival date and time: 04/09/24 2014   Event Date/Time   First Provider Initiated Contact with Patient 04/09/24 2333      Chief Complaint  Patient presents with   Abdominal Pain   Vaginal Discharge   Kristin Salazar , a  33 y.o. [id number] at [redacted]w[redacted]d presents to MAU with complaints of vaginal spotting and lower abdominal cramping.  Spotting: Patient reports a small amount of pinkish red vaginal discharge with mucous like discharge this morning. Denies any other abnormal vaginal discharge. Denies having to wear a pad or passing clots.   Abdominal pain: reports as intermittent and described as cramping. States when its at its worse its a 7/10. Denies taking anything or attempting to relieve symptoms. At this time denies pain. Also denies urinary symptoms and recently sexual activity.          OB History     Gravida  6   Para  2   Term  2   Preterm      AB  2   Living  2      SAB      IAB      Ectopic      Multiple      Live Births  2        Obstetric Comments  Pt reported SAB in 2016, unsure of the month. Pt reported SAB 2018, unsure of month         Past Medical History:  Diagnosis Date   Anxiety    Tension headache     Past Surgical History:  Procedure Laterality Date   APPENDECTOMY     CESAREAN SECTION     GASTRIC BYPASS     NASAL SINUS SURGERY     Tubal reversal  2016    Family History  Problem Relation Age of Onset   Migraines Mother     Social History[1]  Allergies: Allergies[2]  No medications prior to admission.    Review of Systems  Constitutional:  Negative for chills, fatigue and fever.  Eyes:  Negative for pain and visual disturbance.  Respiratory:  Negative for apnea, shortness of breath and wheezing.   Cardiovascular:  Negative for chest pain and palpitations.  Gastrointestinal:  Negative for abdominal pain, constipation, diarrhea, nausea and vomiting.  Genitourinary:  Positive for  pelvic pain and vaginal bleeding. Negative for difficulty urinating, dysuria, vaginal discharge and vaginal pain.  Musculoskeletal:  Negative for back pain.  Neurological:  Negative for seizures, weakness and headaches.  Psychiatric/Behavioral:  Negative for suicidal ideas.    Physical Exam   Blood pressure 117/65, pulse 97, temperature 98.7 F (37.1 C), temperature source Oral, resp. rate 16, height 5' 5 (1.651 m), weight 63.2 kg, last menstrual period 02/23/2024, SpO2 100%, unknown if currently breastfeeding.  Physical Exam Vitals and nursing note reviewed.  Constitutional:      General: She is not in acute distress.    Appearance: Normal appearance. She is not ill-appearing.  HENT:     Head: Normocephalic.  Pulmonary:     Effort: Pulmonary effort is normal.  Abdominal:     Tenderness: There is no abdominal tenderness.  Musculoskeletal:     Cervical back: Normal range of motion.  Skin:    General: Skin is warm and dry.  Neurological:     Mental Status: She is alert and oriented to person, place, and time.  Psychiatric:        Mood and  Affect: Mood normal.     MAU Course  Procedures Orders Placed This Encounter  Procedures   US  OB Transvaginal   Urinalysis, Routine w reflex microscopic -Urine, Clean Catch   hCG, quantitative, pregnancy   Discharge patient Discharge disposition: 01-Home or Self Care; Discharge patient date: 04/09/2024   Results for orders placed or performed during the hospital encounter of 04/09/24 (from the past 24 hours)  Urinalysis, Routine w reflex microscopic -Urine, Clean Catch     Status: Abnormal   Collection Time: 04/09/24  9:08 PM  Result Value Ref Range   Color, Urine YELLOW YELLOW   APPearance CLEAR CLEAR   Specific Gravity, Urine 1.027 1.005 - 1.030   pH 6.0 5.0 - 8.0   Glucose, UA NEGATIVE NEGATIVE mg/dL   Hgb urine dipstick NEGATIVE NEGATIVE   Bilirubin Urine NEGATIVE NEGATIVE   Ketones, ur NEGATIVE NEGATIVE mg/dL   Protein, ur  NEGATIVE NEGATIVE mg/dL   Nitrite NEGATIVE NEGATIVE   Leukocytes,Ua TRACE (A) NEGATIVE   RBC / HPF 0-5 0 - 5 RBC/hpf   WBC, UA 11-20 0 - 5 WBC/hpf   Bacteria, UA RARE (A) NONE SEEN   Squamous Epithelial / HPF 0-5 0 - 5 /HPF   Mucus PRESENT   hCG, quantitative, pregnancy     Status: Abnormal   Collection Time: 04/09/24 10:10 PM  Result Value Ref Range   hCG, Beta Chain, Quant, S 58,054 (H) <5 mIU/mL   US  OB Transvaginal Result Date: 04/09/2024 EXAM: OBSTETRIC ULTRASOUND FIRST TRIMESTER TECHNIQUE: Transvaginal first trimester obstetric pelvic duplex ultrasound was performed with real-time imaging, color flow Doppler imaging, and spectral analysis. COMPARISON: Obstetrical ultrasound 03/31/2024. CLINICAL HISTORY: Vaginal spotting; Abdominal cramping. FINDINGS: UTERUS: No focal myometrial mass. GESTATIONAL SAC(S): Single intrauterine gestational sac is present. A small subchorionic hemorrhage is present. YOLK SAC: Yolk sac measuring 2.2 mm. EMBRYO(<11WK) /FETUS(>=11WK): Embryo and cardiac activity are present. CROWN RUMP LENGTH: Crown rump length measures 10.7 mm corresponding to a gestational age of [redacted] weeks and 1 day. RATE OF CARDIAC ACTIVITY: Heart rate is 139 beats per minute. RIGHT OVARY: The right ovary is normal. Normal arterial and venous flow. LEFT OVARY: The left ovary is not visualized. FREE FLUID: No free fluid. MEASUREMENTS ESTIMATED GESTATIONAL AGE BY CURRENT ULTRASOUND: 7 weeks and 1 day ESTIMATED DUE DATE: 11/25/2024 IMPRESSION: 1. Single intrauterine pregnancy with cardiac activity at 7 weeks and 1 day gestational age. Estimated date of delivery is 11/25/2024. 2. Small subchorionic hemorrhage. Electronically signed by: Greig Pique MD 04/09/2024 11:14 PM EST RP Workstation: [id number]     MDM - Patient recently had a full ectopic work up on 12/19 and noted to have an irregular shaped GS and a SCH.  - Repeat quant ~50% increased from 10 days ago.  - Repeat US  today noted a fetus with  cardiac activity. [facility] still visualized.  - plan for discharge.   Assessment and Plan   1. Abdominal pain, unspecified abdominal location   2. [redacted] weeks gestation of pregnancy   3. Spotting    - Reviewed expectations of spotting with [facility].  - Reviewed worsening signs and return precautions.  - Patient discharged home in stable condition and may return to MAU as needed.    Claris CHRISTELLA Cedar, MSN cNM  04/10/2024, 8:18 AM       [1]  Social History Tobacco Use   Smoking status: Never   Smokeless tobacco: Never  Vaping Use   Vaping status: Never Used  Substance Use Topics  Alcohol use: No   Drug use: No  [2] No Known Allergies  "

## 2024-04-09 NOTE — MAU Note (Signed)
 Kristin Salazar is a 33 y.o. at [redacted]w[redacted]d here in MAU reporting: this morning went to the restroom and saw clear slimy with blood discharge come out - light red blood and now having lower abdominal cramping.   Onset of complaint: 0930 Pain score: 7 Vitals:   04/09/24 2046  BP: (!) 109/56  Pulse: 86  Resp: 16  Temp: 98.7 F (37.1 C)  SpO2: 100%     FHT: NA  Lab orders placed from triage: UA

## 2024-04-11 ENCOUNTER — Telehealth: Payer: Self-pay | Admitting: *Deleted

## 2024-04-11 NOTE — Progress Notes (Signed)
 Location Information: Patient State (at time of visit): [place]  Patient Location (at time of visit):Home/Other Non-Medical  Provider Location: [place]  Is provider licensed to provide clinical care in the current location/state of the patient? Yes   Consent:  Patient's identity was confirmed. Presenting condition or illness was discussed with the patient/personal representative. Current proposed treatment for presenting condition or illness was explained to patient/personal representative along with the likely benefits and any significant risks or complications associated with the provision of treatment by audio/video means. The patient/personal representative verbally authorized treatment to be provided by audio/video, which may include a limited review of patient's current health status, medication, or other treatment recommendations, patient education, and an opportunity to ask questions about condition and treatment. Verbal Consent Granted by Patient/Personal Representative:Yes   Visit Information: Modality: 2-Way Real-Time Audio/Video  Video Start Time: 2:45 PM Video Stop Time: 2:56 PM Video Total Time: 23m 26s   HPI- The patient new to [facility] presents to [facility]V with symptoms of an upper respiratory infection. The symptoms of the upper respiratory infection are described as rhinorrhea, sore throat, nasal congestion and cough. The severity of the symptoms associated to the upper respiratory infection is moderate. The timing/course of upper respiratory infection symptoms is constant and remains unchanged. The symptoms of upper respiratory infection have lasted for 1 days.  Associated symptoms consist of fatigue, fever, headache, myalgias, chills, denies difficulty swallowing, denies ear pain and denies nausea. Has not tried OTC meds. Has known sick contacts including kids. Has not been tested for COVID/flu.  Currently [redacted] weeks pregnant.  Physical Exam Constitutional:       General: She is not in acute distress.    Appearance: Normal appearance. She is not toxic-appearing.  HENT:     Nose: Nose normal. No rhinorrhea.     Mouth/Throat:     Pharynx: Oropharynx is clear. Uvula midline. No oropharyngeal exudate or posterior oropharyngeal erythema.  Eyes:     General: No scleral icterus.       Right eye: No discharge.        Left eye: No discharge.     Extraocular Movements: Extraocular movements intact.     Conjunctiva/sclera: Conjunctivae normal.  Pulmonary:     Effort: Pulmonary effort is normal. No respiratory distress.     Breath sounds: No stridor.     Comments: Able to speak in fluid sentences with no gasping.  No audible wheeze Musculoskeletal:     Cervical back: Neck supple. No rigidity.  Neurological:     Mental Status: She is alert.     Assessment/ Plan Diagnoses and all orders for this visit:  Acute URI Discussed likely influenza illness given exposure and acute onset of symptoms.  Discussed Tamiflu and safety for pregnancy along with safe over-the-counter medications.  Note for work given. Other orders -     oseltamivir (Tamiflu) 75 mg capsule; Take 1 capsule (75 mg total) by mouth in the morning and 1 capsule (75 mg total) in the evening. Take with meals. Do all this for 5 days.    Here are some recommendations, including over-the-counter options:   - Be sure to rest and stay well hydrated (drink plenty of water). Keep in mind that cold fluids may worsen your cough, so it is best to drink room temperature or warm beverages instead.   - You may take an expectorant such as guaifenesin (Mucinex) if you need help thinning out your sputum.  - You may use a short course of  a nasal decongestant (Afrin), but do not use this nasal spray for longer than 10 days as it may lead to rebound (worsening) congestion with prolonged use.  - Take Acetaminophen (Tylenol) and/or ibuprofen (Motrin/Advil) as needed for pain and/or fever.  - You might also find  relief from hot liquids (like tea), humidified air (from a steamy shower or humidifier), warm salt water gargles, and throat lozenges/sprays (sold OTC as Cepacol and Chloraseptic).      Please seek prompt medical attention for any new or worsening symptoms: severe/worsening headache, abnormal vision, confusion, neck pain/stiffness, swelling around the face or eyes, worsening chest tightness, shortness of breath, difficulty breathing.    For questions or concerns regarding this visit, patient should contact Virtual Support at [phone].  Disposition:  Patient to continue care at home.  Electronically signed: Elsie CHRISTELLA Glance, MD 04/11/2024  2:45 PM

## 2024-04-11 NOTE — Telephone Encounter (Signed)
 Patient had an U/S done at the hospital on 04/09/2024. Patient wants to know if she is able to cancel U/S on 04/17/2024.

## 2024-04-17 ENCOUNTER — Other Ambulatory Visit

## 2024-04-20 NOTE — Progress Notes (Signed)
 SUBJECTIVE   Patient ID: Kristin Salazar is a 34 y.o. (DOB [birth year]) female  Chief Complaint  Patient presents with   New Patient   Annual Exam   Cough issues    States that she has trouble coughing  ever since having covid in 2021.    Pregnant    Recently found out she is pregnant. She stopped all of her psych meds due to this, but is having increased anxiety due to having several miscarriages in the past.    Vaginal Discharge    Have white, odorous discharge    History of Present Illness The patient presents for an annual visit.  She has been experiencing difficulty in coughing since her initial COVID-19 infection in 2021, describing it as an inability to produce a cough despite the urge to do so. This symptom was exacerbated following a recent influenza infection. She reports a sensation of chest tightness and pressure when attempting to cough, but does not experience a foreign body sensation in her throat. She occasionally experiences shortness of breath and difficulty in taking deep breaths but does not report any wheezing. She also reports fatigue after climbing stairs.  She discontinued all her medications 2 months ago due to her pregnancy and is currently feeling overwhelmed. She has an upcoming appointment with her obstetrician at the end of this month. She is currently taking prenatal vitamins.  She noticed an increase in vaginal discharge yesterday, which she describes as white and copious. She has observed this symptom previously, but it was less severe. She does not report any associated itching or burning or pelvic pain.      OBJECTIVE   Allergies[1]  Current Medications[2]  Problem List[3]  Medical History[4]    Surgical History[5]  Family History[6]  BP 119/63   Pulse 77   Ht 1.676 m (5' 6)   Wt 63.9 kg (140 lb 12.8 oz)   LMP 01/31/2024 (Approximate)   SpO2 98%   BMI 22.73 kg/m   Review of Systems Systemic: Feeling fine.  No fever   and no chills. Psychological: Increased anxiety  All other symptoms are reviewed and are negative.  Physical Exam Constitutional: Oriented to person, place and time. Appears well-developed and well nourished. Cardiovascular: Normal rate and rhythm.  Exam reveals no gallop and no friction rub.  No murmur heard.  Pulmonary/chest: Effort normal and breath sounds normal. No wheezes. No rales.  Neuro:  CN grossly intact Psychiatric: Normal mood and affect. Behavior normal. Judgement and thought content normal.    ASSESSMENT/PLAN   1. Pregnancy, unspecified gestational age (CMD)      2. Depression, unspecified depression type  buPROPion (WELLBUTRIN) 75 mg tablet    3. Dyspnea, unspecified type  AH Wake-Pulmonary Function Test Standard PFT (Spirometry, Lung Volumes, Carbon Monoxide Diffusing Capacity)      Assessment & Plan 1. 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 - Pulmonary function testing will be arranged to screen for asthma. - If confirmed, appropriate inhalers will be prescribed, taking into consideration her recent pregnancy.  2. Pregnancy: - She is currently in her first trimester of pregnancy. - Advised to discontinue desvenlafaxine, trazodone, and rizatriptan . A prescription for bupropion 75 mg has been provided. - She is currently taking prenatal vitamins with iron and folic acid, which she should continue.  3. Vaginal discharge: - She reported noticing a significant amount of vaginal which is common in the first trimester of pregnancy. - A swab  is not necessary, as she is overall asymptomatic.  Return if symptoms worsen or fail to improve.  Orders Placed This Encounter  Procedures   AH Wake-Pulmonary Function Test Standard PFT (Spirometry, Lung Volumes, Carbon Monoxide Diffusing Capacity)    Requested Prescriptions   Signed Prescriptions Disp Refills   buPROPion (WELLBUTRIN) 75 mg  tablet 180 tablet 3    Sig: Take 1 tablet (75 mg total) by mouth 2 (two) times a day.    Risks, benefits, and alternatives of the medication(s) and treatment plan(s) were discussed, and she expressed understanding. Plan follow up as discussed or as needed. No barriers to treatment identified in this visit.    A medical student assisted in documenting this service. I saw and evaluated the patient, reviewed all information documented by the student, and made modifications to such information, where appropriate.  Tully Shawnee Gladis Sharl, PA-C 04/20/2024             [1] No Known Allergies [2] Current Outpatient Medications  Medication Sig Dispense Refill   acetaminophen (TYLENOL) 500 mg tablet Take 500 mg by mouth every 6 (six) hours as needed.     desvenlafaxine (PRISTIQ) 25 mg Tb24 24 hr tablet Take 25 mg by mouth daily.     buPROPion (WELLBUTRIN) 75 mg tablet Take 1 tablet (75 mg total) by mouth 2 (two) times a day. 180 tablet 3   lidocaine (LIDODERM) 5 % patch Apply patch to painful area. Patch may remain in place for up to 12 hours in a 24 hour period. (Patient not taking: Reported on 04/20/2024) 30 patch 0   rizatriptan  MLT (MAXALT -MLT) 5 mg disintegrating tablet Take 1 tablet onset migraine, another 2 hours later if needed. (Max 2 tabs/24hrs). Only 3 days a week. (Patient not taking: Reported on 04/20/2024)     traZODone (DESYREL) 100 mg tablet Take 100 mg by mouth nightly. (Patient not taking: Reported on 04/20/2024)     No current facility-administered medications for this visit.  [3] Patient Active Problem List Diagnosis   Chronic seasonal allergic rhinitis due to pollen   Microcytic hypochromic anemia   Mixed anxiety and depressive disorder   Migraine without aura   IDA (iron deficiency anemia)   Insomnia due to other mental disorder   Cyclic neutropenia    (CMD)   Elevated liver enzymes   Right shoulder pain   Iron deficiency   Wellness examination   [4] Past Medical History: Diagnosis Date   Acquired right foot drop    Chronic seasonal allergic rhinitis due to pollen    IDA (iron deficiency anemia)    Insomnia due to other mental disorder    Microcytic hypochromic anemia    Migraine without aura    Mixed anxiety and depressive disorder   [5] Past Surgical History: Procedure Laterality Date   BARIATRIC SURGERY     BICEPS TENODESIS Right 01/22/2022   Procedure: SHOULDER ARTHROSCOPY W/ BICEPS TENODESIS, EXTENSIVE DEBRIDEMENT;  Surgeon: Case, Jordan Miller, MD;  Location: [facility];  Service: Orthopedics;  Laterality: Right;   BREAST AUGMENTATION     Procedure: BREAST IMPLANT PLACEMENT; 08/09/2021   BREAST SURGERY     Procedure: BREAST SURGERY   CESAREAN SECTION  09/07/12   GASTRIC BYPASS     Procedure: GASTRIC BYPASS   OTHER SURGICAL HISTORY     Procedure: OTHER SURGICAL HISTORY (TUBAL LIGATION REVERSAL)   SINUS SURGERY     Procedure: SINUS SURGERY   TUBAL LIGATION     Procedure: TUBAL LIGATION  [  6] Family History Problem Relation Name Age of Onset   Eczema Neg Hx     Psoriasis Neg Hx

## 2024-05-04 ENCOUNTER — Encounter: Payer: Self-pay | Admitting: Obstetrics and Gynecology

## 2024-05-04 ENCOUNTER — Ambulatory Visit: Admitting: Obstetrics and Gynecology

## 2024-05-04 ENCOUNTER — Other Ambulatory Visit

## 2024-05-04 VITALS — BP 121/75 | HR 75 | Wt 150.0 lb

## 2024-05-04 DIAGNOSIS — Z3481 Encounter for supervision of other normal pregnancy, first trimester: Secondary | ICD-10-CM | POA: Diagnosis not present

## 2024-05-04 DIAGNOSIS — Z9884 Bariatric surgery status: Secondary | ICD-10-CM | POA: Insufficient documentation

## 2024-05-04 DIAGNOSIS — Z3A10 10 weeks gestation of pregnancy: Secondary | ICD-10-CM

## 2024-05-04 DIAGNOSIS — O09299 Supervision of pregnancy with other poor reproductive or obstetric history, unspecified trimester: Secondary | ICD-10-CM | POA: Insufficient documentation

## 2024-05-04 DIAGNOSIS — Z9889 Other specified postprocedural states: Secondary | ICD-10-CM | POA: Insufficient documentation

## 2024-05-04 DIAGNOSIS — Z3480 Encounter for supervision of other normal pregnancy, unspecified trimester: Secondary | ICD-10-CM | POA: Insufficient documentation

## 2024-05-04 DIAGNOSIS — O09529 Supervision of elderly multigravida, unspecified trimester: Secondary | ICD-10-CM | POA: Insufficient documentation

## 2024-05-04 DIAGNOSIS — O34219 Maternal care for unspecified type scar from previous cesarean delivery: Secondary | ICD-10-CM | POA: Insufficient documentation

## 2024-05-04 NOTE — Progress Notes (Signed)
 Patient informed that the ultrasound is considered a limited obstetric ultrasound and is not intended to be a complete ultrasound exam.  Patient also informed that the ultrasound is not being completed with the intent of assessing for fetal or placental anomalies or any pelvic abnormalities. Explained that the purpose of today's ultrasound is to assess for fetal heart rate.  Patient acknowledges the purpose of the exam and the limitations of the study.      [name], RN

## 2024-05-04 NOTE — Assessment & Plan Note (Deleted)
" °  NURSING  PROVIDER  Office Location [place] Dating by LMP  [facility] Model Traditional Anatomy U/S   Initiated care at  [facility]  English              LAB RESULTS   Support Person  Genetics NIPS: No results found for: NTRARPTSUMM AFP:     NT/IT (FT only)     Carrier Screen Horizon:   Rhogam  --/--/O POS (06/24 1518) A1C/GTT Early HgbA1C:  Third trimester 2 hr GTT:   Flu Vaccine     TDaP Vaccine   Blood Type --/--/O POS (06/24 1518)  RSV Vaccine  Antibody    COVID Vaccine  Rubella    Feeding Plan  RPR    Contraception  HBsAg    Circumcision  HIV    Pediatrician   HCVAb    Prenatal Classes     BTL Consent  Pap No results found for: DIAGPAP  BTL Pre-payment  GC/CT Initial:  03/31/24 Negative  36wks:    VBAC Consent  GBS   For PCN allergy, check sensitivities   BRx Optimized? [ ]  yes   [ ]  no    DME Rx [ ]  BP cuff [ ]  Weight Scale Waterbirth  [ ]  Class [ ]  Consent [ ]  CNM visit  PHQ9 & GAD7 [  ] new OB [  ] 28 weeks  [  ] 36 weeks Induction  [ ]  Orders Entered [ ] Foley Y/N    "

## 2024-05-04 NOTE — Progress Notes (Signed)
 "   Chief Complaint  Patient presents with   Initial Prenatal Visit    Subjective:   Kristin Salazar is a 34 y.o. [id number] at [redacted]w[redacted]d by LMP c/w 7 wk ultrasound being seen today for her first obstetrical visit.    Patient does not intend to breast feed. Pregnancy history fully reviewed.  Pregnancy complicated by: Hx 4th degree tear with first vaginal delivery Hx cesarean Hx tubal reversal: confidential to her partner Hx gastric bypass: mini bypass in [place], patient will bring the report.  She denies any hx GDM, HTN, preeclampsia, postpartum hemorrhage, herpes  Patient reports no complaints.  HISTORY: OB History  Gravida Para Term Preterm AB Living  6 2 2  0 2 2  SAB IAB Ectopic Multiple Live Births  0 0 0 0 2    # Outcome Date GA Lbr Len/2nd Weight Sex Type Anes PTL Lv  6 Current           5 Term 09/08/14 [redacted]w[redacted]d   M CS-LTranv   LIV  4 Term 12/07/07 [redacted]w[redacted]d   F Vag-Spont   LIV     Birth Comments: 4th degree tear  3 AB           2 AB           1 Gravida             Obstetric Comments  Pt reported SAB in 2016, unsure of the month. Pt reported SAB 2018, unsure of month     Last pap smear: 11/13/2021 NILM/HPV neg  Past Medical History:  Diagnosis Date   Anxiety    Tension headache    Past Surgical History:  Procedure Laterality Date   APPENDECTOMY     CESAREAN SECTION     GASTRIC BYPASS     NASAL SINUS SURGERY     Tubal reversal  2016   Family History  Problem Relation Age of Onset   Migraines Mother    Social History[1] Allergies[2] Medications Ordered Prior to Encounter[3]   Exam   Vitals:   05/04/24 0958  BP: 121/75  Pulse: 75  Weight: 150 lb (68 kg)   Fetal Heart Rate (bpm):  (ultrasound used; unable to find by doppler) Unable to get doptones, bedside ultrasound confirms +FCA   General:  Alert, oriented and cooperative. Patient is in no acute distress.  Breast: deferred  Cardiovascular: Normal heart rate noted  Respiratory: Normal  respiratory effort, no problems with respiration noted  Abdomen: Soft, gravid, appropriate for gestational age.  Pain/Pressure: Absent     Pelvic: Cervical exam deferred        Extremities: Normal range of motion.  Edema: None  Mental Status: Normal mood and affect. Normal behavior. Normal judgment and thought content.        Assessment:   Pregnancy: [id number] Patient Active Problem List   Diagnosis Date Noted   Supervision of other normal pregnancy, antepartum 05/04/2024   Antepartum multigravida of advanced maternal age 46/22/2026   History of cesarean delivery, antepartum 05/04/2024   History of maternal fourth degree perineal laceration, currently pregnant 05/04/2024   History of reversal of tubal ligation 05/04/2024   History of gastric bypass 05/04/2024     Plan:  1. Supervision of other normal pregnancy, antepartum (Primary) - CBC/D/Plt+RPR+Rh+ABO+RubIgG... - Culture, OB Urine - PANORAMA PRENATAL TEST - HORIZON Basic Panel - US  MFM OB COMP + 14 WK; Future - US  OB Limited; Future  2. Antepartum multigravida of advanced maternal age - 63 PRENATAL  TEST  3. History of cesarean delivery, antepartum Plans repeat  4. History of maternal fourth degree perineal laceration, currently pregnant Plans c-section  5. History of reversal of tubal ligation Confidential to partner  6. History of gastric bypass Patient to bring report from surgery Will screen for micronutrient deficiencies Repeat q trimester Serial growth - Vitamin B12 - Folate - Iron and TIBC - Ferritin - VITAMIN D 25 Hydroxy (Vit-D Deficiency, Fractures) - Vitamin B1 - Calcium  7. [redacted] weeks gestation of pregnancy - US  OB Limited; Future    Initial labs ordered Continue prenatal vitamins. Genetic Screening discussed: NIPS, carrier screening and AFP, requested. Ultrasound discussed; fetal anatomic survey: requested. Problem list reviewed and updated. The nature of [facility] with multiple MDs and other Advanced Practice Providers was explained to patient; also emphasized that residents, students are part of our team. Routine obstetric precautions reviewed. Return in about 4 weeks (around 06/01/2024).  Rollo ONEIDA Bring, MD, FACOG Obstetrician & Gynecologist, [facility] [employer], [facility]     [1]  Social History Tobacco Use   Smoking status: Never   Smokeless tobacco: Never  Vaping Use   Vaping status: Never Used  Substance Use Topics   Alcohol use: No   Drug use: No  [2] No Known Allergies [3]  Current Outpatient Medications on File Prior to Visit  Medication Sig Dispense Refill   acetaminophen (TYLENOL) 500 MG tablet Take 500 mg by mouth every 6 (six) hours as needed.     buPROPion (WELLBUTRIN XL) 300 MG 24 hr tablet Take 300 mg by mouth daily.     cyclobenzaprine  (FLEXERIL ) 10 MG tablet Take 1 tablet (10 mg total) by mouth 2 (two) times daily as needed for muscle spasms. 20 tablet 0   desvenlafaxine (PRISTIQ) 25 MG 24 hr tablet Take 25 mg by mouth daily.     Prenatal Vit-Fe Fumarate-FA (MULTIVITAMIN-PRENATAL) 27-0.8 MG TABS tablet Take 1 tablet by mouth daily at 12 noon.     rizatriptan  (MAXALT -MLT) 5 MG disintegrating tablet Take 1 tablet onset migraine, another 2 hours later if needed. (Max 2 tabs/24hrs). Only 3 days a week. 10 tablet 2   traZODone (DESYREL) 150 MG tablet Take 100 mg by mouth at bedtime.     No current facility-administered medications on file prior to visit.   "

## 2024-05-05 ENCOUNTER — Ambulatory Visit: Payer: Self-pay | Admitting: Obstetrics and Gynecology

## 2024-05-05 DIAGNOSIS — Z3480 Encounter for supervision of other normal pregnancy, unspecified trimester: Secondary | ICD-10-CM

## 2024-05-05 DIAGNOSIS — O2340 Unspecified infection of urinary tract in pregnancy, unspecified trimester: Secondary | ICD-10-CM

## 2024-05-05 LAB — IRON AND TIBC
Iron Saturation: 19 % (ref 15–55)
Iron: 73 ug/dL (ref 27–159)
Total Iron Binding Capacity: 388 ug/dL (ref 250–450)
UIBC: 315 ug/dL (ref 131–425)

## 2024-05-05 LAB — FOLATE: Folate: 13.4 ng/mL

## 2024-05-05 LAB — FERRITIN: Ferritin: 14 ng/mL — ABNORMAL LOW (ref 15–150)

## 2024-05-09 LAB — URINE CULTURE, OB REFLEX

## 2024-05-09 LAB — CULTURE, OB URINE

## 2024-05-10 MED ORDER — CEPHALEXIN 500 MG PO CAPS: 500.0000 mg | ORAL_CAPSULE | Freq: Four times a day (QID) | ORAL | 2 refills | Status: AC

## 2024-05-11 LAB — VITAMIN B1: Thiamine: 133.6 nmol/L (ref 66.5–200.0)

## 2024-05-11 LAB — HCV INTERPRETATION

## 2024-05-11 LAB — CBC/D/PLT+RPR+RH+ABO+RUBIGG...
Antibody Screen: NEGATIVE
Basophils Absolute: 0 10*3/uL (ref 0.0–0.2)
Basos: 0 %
EOS (ABSOLUTE): 0.1 10*3/uL (ref 0.0–0.4)
Eos: 1 %
HCV Ab: NONREACTIVE
HIV Screen 4th Generation wRfx: NONREACTIVE
Hematocrit: 34.6 % (ref 34.0–46.6)
Hemoglobin: 11.1 g/dL (ref 11.1–15.9)
Hepatitis B Surface Ag: NEGATIVE
Immature Grans (Abs): 0 10*3/uL (ref 0.0–0.1)
Immature Granulocytes: 0 %
Lymphocytes Absolute: 1.5 10*3/uL (ref 0.7–3.1)
Lymphs: 20 %
MCH: 30.3 pg (ref 26.6–33.0)
MCHC: 32.1 g/dL (ref 31.5–35.7)
MCV: 95 fL (ref 79–97)
Monocytes Absolute: 0.5 10*3/uL (ref 0.1–0.9)
Monocytes: 7 %
Neutrophils Absolute: 5.5 10*3/uL (ref 1.4–7.0)
Neutrophils: 72 %
Platelets: 291 10*3/uL (ref 150–450)
RBC: 3.66 x10E6/uL — ABNORMAL LOW (ref 3.77–5.28)
RDW: 12.4 % (ref 11.7–15.4)
RPR Ser Ql: NONREACTIVE
Rh Factor: POSITIVE
Rubella Antibodies, IGG: <0.9 {index} — ABNORMAL LOW
WBC: 7.7 10*3/uL (ref 3.4–10.8)

## 2024-05-11 LAB — CALCIUM: Calcium: 8.6 mg/dL — ABNORMAL LOW (ref 8.7–10.2)

## 2024-05-11 LAB — VITAMIN D 25 HYDROXY (VIT D DEFICIENCY, FRACTURES): Vit D, 25-Hydroxy: 14 ng/mL — ABNORMAL LOW (ref 30.0–100.0)

## 2024-05-11 LAB — VITAMIN B12: Vitamin B-12: 523 pg/mL (ref 232–1245)

## 2024-05-13 LAB — PANORAMA PRENATAL TEST FULL PANEL:PANORAMA TEST PLUS 5 ADDITIONAL MICRODELETIONS: FETAL FRACTION: 9.5

## 2024-05-15 LAB — HORIZON CUSTOM: REPORT SUMMARY: NEGATIVE

## 2024-06-01 ENCOUNTER — Encounter: Admitting: Obstetrics and Gynecology

## 2024-06-08 ENCOUNTER — Other Ambulatory Visit
